# Patient Record
Sex: Male | Born: 1945 | Race: White | Hispanic: No | Marital: Single | Smoking: Former smoker
Health system: Southern US, Community
[De-identification: ages and names within clinical notes are randomized; demographics above are authoritative.]

## PROBLEM LIST (undated history)

## (undated) DIAGNOSIS — J90 Pleural effusion, not elsewhere classified: Secondary | ICD-10-CM

## (undated) DIAGNOSIS — I714 Abdominal aortic aneurysm, without rupture, unspecified: Secondary | ICD-10-CM

## (undated) DIAGNOSIS — K5090 Crohn's disease, unspecified, without complications: Secondary | ICD-10-CM

## (undated) DIAGNOSIS — K429 Umbilical hernia without obstruction or gangrene: Secondary | ICD-10-CM

## (undated) DIAGNOSIS — F17210 Nicotine dependence, cigarettes, uncomplicated: Secondary | ICD-10-CM

## (undated) DIAGNOSIS — I3139 Other pericardial effusion (noninflammatory): Secondary | ICD-10-CM

## (undated) DIAGNOSIS — I313 Pericardial effusion (noninflammatory): Secondary | ICD-10-CM

## (undated) DIAGNOSIS — Z86711 Personal history of pulmonary embolism: Secondary | ICD-10-CM

---

## 2020-01-08 HISTORY — PX: THORACENTESIS: SHX235

## 2020-01-18 ENCOUNTER — Encounter (HOSPITAL_COMMUNITY): Payer: Self-pay

## 2020-01-18 ENCOUNTER — Inpatient Hospital Stay (HOSPITAL_COMMUNITY): Payer: No Typology Code available for payment source

## 2020-01-18 ENCOUNTER — Inpatient Hospital Stay (HOSPITAL_COMMUNITY)
Admission: EM | Admit: 2020-01-18 | Discharge: 2020-01-25 | DRG: 270 | Disposition: A | Discharge status: Home / Self Care | Payer: No Typology Code available for payment source | Attending: Internal Medicine | Admitting: Internal Medicine

## 2020-01-18 ENCOUNTER — Other Ambulatory Visit: Payer: Self-pay

## 2020-01-18 DIAGNOSIS — I714 Abdominal aortic aneurysm, without rupture, unspecified: Secondary | ICD-10-CM | POA: Diagnosis present

## 2020-01-18 DIAGNOSIS — I314 Cardiac tamponade: Secondary | ICD-10-CM | POA: Diagnosis present

## 2020-01-18 DIAGNOSIS — R071 Chest pain on breathing: Secondary | ICD-10-CM | POA: Diagnosis not present

## 2020-01-18 DIAGNOSIS — J910 Malignant pleural effusion: Secondary | ICD-10-CM | POA: Diagnosis present

## 2020-01-18 DIAGNOSIS — Z8679 Personal history of other diseases of the circulatory system: Secondary | ICD-10-CM | POA: Diagnosis not present

## 2020-01-18 DIAGNOSIS — E43 Unspecified severe protein-calorie malnutrition: Secondary | ICD-10-CM | POA: Insufficient documentation

## 2020-01-18 DIAGNOSIS — J90 Pleural effusion, not elsewhere classified: Secondary | ICD-10-CM | POA: Diagnosis not present

## 2020-01-18 DIAGNOSIS — I739 Peripheral vascular disease, unspecified: Secondary | ICD-10-CM | POA: Diagnosis present

## 2020-01-18 DIAGNOSIS — Z20822 Contact with and (suspected) exposure to covid-19: Secondary | ICD-10-CM | POA: Diagnosis present

## 2020-01-18 DIAGNOSIS — I2699 Other pulmonary embolism without acute cor pulmonale: Secondary | ICD-10-CM | POA: Diagnosis present

## 2020-01-18 DIAGNOSIS — I313 Pericardial effusion (noninflammatory): Secondary | ICD-10-CM | POA: Diagnosis present

## 2020-01-18 DIAGNOSIS — E876 Hypokalemia: Secondary | ICD-10-CM | POA: Diagnosis present

## 2020-01-18 DIAGNOSIS — Z7901 Long term (current) use of anticoagulants: Secondary | ICD-10-CM | POA: Diagnosis not present

## 2020-01-18 DIAGNOSIS — R079 Chest pain, unspecified: Secondary | ICD-10-CM | POA: Diagnosis not present

## 2020-01-18 DIAGNOSIS — Z87891 Personal history of nicotine dependence: Secondary | ICD-10-CM

## 2020-01-18 DIAGNOSIS — J9601 Acute respiratory failure with hypoxia: Secondary | ICD-10-CM | POA: Diagnosis present

## 2020-01-18 DIAGNOSIS — R0600 Dyspnea, unspecified: Secondary | ICD-10-CM

## 2020-01-18 DIAGNOSIS — D649 Anemia, unspecified: Secondary | ICD-10-CM | POA: Diagnosis present

## 2020-01-18 DIAGNOSIS — Z86711 Personal history of pulmonary embolism: Secondary | ICD-10-CM | POA: Diagnosis not present

## 2020-01-18 DIAGNOSIS — D6869 Other thrombophilia: Secondary | ICD-10-CM | POA: Diagnosis present

## 2020-01-18 DIAGNOSIS — C3490 Malignant neoplasm of unspecified part of unspecified bronchus or lung: Secondary | ICD-10-CM | POA: Diagnosis present

## 2020-01-18 DIAGNOSIS — Z6821 Body mass index (BMI) 21.0-21.9, adult: Secondary | ICD-10-CM

## 2020-01-18 DIAGNOSIS — I3139 Other pericardial effusion (noninflammatory): Secondary | ICD-10-CM

## 2020-01-18 DIAGNOSIS — D696 Thrombocytopenia, unspecified: Secondary | ICD-10-CM | POA: Diagnosis present

## 2020-01-18 DIAGNOSIS — K5090 Crohn's disease, unspecified, without complications: Secondary | ICD-10-CM | POA: Diagnosis present

## 2020-01-18 DIAGNOSIS — Z4682 Encounter for fitting and adjustment of non-vascular catheter: Secondary | ICD-10-CM

## 2020-01-18 HISTORY — DX: Umbilical hernia without obstruction or gangrene: K42.9

## 2020-01-18 HISTORY — DX: Nicotine dependence, cigarettes, uncomplicated: F17.210

## 2020-01-18 HISTORY — DX: Pericardial effusion (noninflammatory): I31.3

## 2020-01-18 HISTORY — DX: Abdominal aortic aneurysm, without rupture, unspecified: I71.40

## 2020-01-18 HISTORY — DX: Abdominal aortic aneurysm, without rupture: I71.4

## 2020-01-18 HISTORY — DX: Pleural effusion, not elsewhere classified: J90

## 2020-01-18 HISTORY — DX: Personal history of pulmonary embolism: Z86.711

## 2020-01-18 HISTORY — DX: Crohn's disease, unspecified, without complications: K50.90

## 2020-01-18 HISTORY — DX: Other pericardial effusion (noninflammatory): I31.39

## 2020-01-18 LAB — TROPONIN I (HIGH SENSITIVITY): Troponin I (High Sensitivity): 12 ng/L (ref <18)

## 2020-01-18 LAB — COMPREHENSIVE METABOLIC PANEL
ALT: 115 U/L — ABNORMAL HIGH (ref 0–44)
AST: 115 U/L — ABNORMAL HIGH (ref 15–41)
Albumin: 2.8 g/dL — ABNORMAL LOW (ref 3.5–5.0)
Alkaline Phosphatase: 73 U/L (ref 38–126)
Anion gap: 15 (ref 5–15)
BUN: 28 mg/dL — ABNORMAL HIGH (ref 8–23)
CO2: 17 mmol/L — ABNORMAL LOW (ref 22–32)
Calcium: 8.4 mg/dL — ABNORMAL LOW (ref 8.9–10.3)
Chloride: 101 mmol/L (ref 98–111)
Creatinine, Ser: 1.64 mg/dL — ABNORMAL HIGH (ref 0.61–1.24)
GFR calc Af Amer: 48 mL/min — ABNORMAL LOW (ref >60)
GFR calc non Af Amer: 41 mL/min — ABNORMAL LOW (ref >60)
Glucose, Bld: 130 mg/dL — ABNORMAL HIGH (ref 70–99)
Potassium: 4.5 mmol/L (ref 3.5–5.1)
Sodium: 133 mmol/L — ABNORMAL LOW (ref 135–145)
Total Bilirubin: 1 mg/dL (ref 0.3–1.2)
Total Protein: 5.5 g/dL — ABNORMAL LOW (ref 6.5–8.1)

## 2020-01-18 LAB — CBC
HCT: 37.1 % — ABNORMAL LOW (ref 39.0–52.0)
Hemoglobin: 11.1 g/dL — ABNORMAL LOW (ref 13.0–17.0)
MCH: 29.1 pg (ref 26.0–34.0)
MCHC: 29.9 g/dL — ABNORMAL LOW (ref 30.0–36.0)
MCV: 97.1 fL (ref 80.0–100.0)
Platelets: 251 10*3/uL (ref 150–400)
RBC: 3.82 MIL/uL — ABNORMAL LOW (ref 4.22–5.81)
RDW: 13.4 % (ref 11.5–15.5)
WBC: 16.9 10*3/uL — ABNORMAL HIGH (ref 4.0–10.5)
nRBC: 0 % (ref 0.0–0.2)

## 2020-01-18 LAB — ECHOCARDIOGRAM COMPLETE
Height: 68 in
Weight: 2320 oz

## 2020-01-18 LAB — PROTIME-INR
INR: 1.6 — ABNORMAL HIGH (ref 0.8–1.2)
Prothrombin Time: 18.4 seconds — ABNORMAL HIGH (ref 11.4–15.2)

## 2020-01-18 LAB — APTT: aPTT: 33 seconds (ref 24–36)

## 2020-01-18 LAB — LACTIC ACID, PLASMA: Lactic Acid, Venous: 3.1 mmol/L (ref 0.5–1.9)

## 2020-01-18 LAB — SARS CORONAVIRUS 2 BY RT PCR (HOSPITAL ORDER, PERFORMED IN ~~LOC~~ HOSPITAL LAB): SARS Coronavirus 2: NEGATIVE

## 2020-01-18 MED ORDER — DOCUSATE SODIUM 100 MG PO CAPS: 100.0000 mg | ORAL_CAPSULE | Freq: Two times a day (BID) | ORAL | Status: DC | PRN

## 2020-01-18 MED ORDER — FENTANYL CITRATE (PF) 100 MCG/2ML IJ SOLN
25.0000 ug | Freq: Once | INTRAMUSCULAR | Status: AC
  Administered 2020-01-18: 25 ug via INTRAVENOUS
  Filled 2020-01-18: qty 2

## 2020-01-18 MED ORDER — POLYETHYLENE GLYCOL 3350 17 G PO PACK: 17.0000 g | PACK | Freq: Every day | ORAL | Status: DC | PRN

## 2020-01-18 NOTE — ED Provider Notes (Signed)
[facility] EMERGENCY DEPARTMENT Provider Note   CSN: [id number] Arrival date & time: 01/18/20  1950     History Chief Complaint  Patient presents with   Shortness of Breath    Glen Scott is a 74 y.o. male.  The history is provided by the patient.  Shortness of Breath Severity:  Moderate Onset quality:  Gradual Duration:  3 weeks Timing:  Constant Progression:  Worsening Chronicity:  New Relieved by:  Nothing Worsened by:  Nothing Ineffective treatments:  None tried Associated symptoms: abdominal pain and chest pain   Associated symptoms: no cough, no ear pain, no fever, no rash, no sore throat and no vomiting        Past Medical History:  Diagnosis Date   AAA (abdominal aortic aneurysm) (HCC)    Bilateral pleural effusion    Cigarette smoker    Crohn's disease ([place])    History of pulmonary embolus (PE)    Pericardial effusion    Umbilical hernia     Patient Active Problem List   Diagnosis Date Noted   Bilateral pulmonary embolism ([place]) 01/19/2020   Pleural effusion, left 01/19/2020   AAA (abdominal aortic aneurysm) without rupture ([place]) 01/19/2020   Pericardial effusion with cardiac tamponade 01/18/2020   Chest pain     Past Surgical History:  Procedure Laterality Date   THORACENTESIS  01/08/2020   [place], [facility]       Family History  Family history unknown: Yes    Social History   Tobacco Use   Smoking status: Not on file  Substance Use Topics   Alcohol use: Not on file   Drug use: Not on file    Home Medications Prior to Admission medications   Medication Sig Start Date End Date Taking? Authorizing Provider  colchicine 0.6 MG tablet Take 0.6 mg by mouth 2 (two) times daily.   Yes [provider]  ipratropium-albuterol (DUONEB) 0.5-2.5 (3) MG/3ML SOLN Take 3 mLs by nebulization in the morning and at bedtime.   Yes [provider]  Rivaroxaban (XARELTO) 15 MG TABS tablet Take  15 mg by mouth 2 (two) times daily with a meal. Started on 01-10-20. Take until 01-29-20   Yes [provider]    Allergies    Morphine and related and Penicillins  Review of Systems   Review of Systems  Constitutional: Negative for chills and fever.  HENT: Negative for ear pain and sore throat.   Eyes: Negative for pain and visual disturbance.  Respiratory: Positive for shortness of breath. Negative for cough.   Cardiovascular: Positive for chest pain. Negative for palpitations.  Gastrointestinal: Positive for abdominal pain. Negative for vomiting.  Genitourinary: Negative for dysuria and hematuria.  Musculoskeletal: Negative for arthralgias and back pain.  Skin: Negative for color change and rash.  Neurological: Negative for seizures and syncope.  All other systems reviewed and are negative.   Physical Exam Updated Vital Signs BP (!) 148/79    Pulse 88    Temp 98.7 F (37.1 C) (Oral)    Resp 17    Ht 5\' 8"  (1.727 m)    Wt 67.7 kg    SpO2 100%    BMI 22.69 kg/m   Physical Exam Vitals and nursing note reviewed.  Constitutional:      Appearance: He is well-developed.     Comments: Appears uncomfortable  HENT:     Head: Normocephalic and atraumatic.  Eyes:     Conjunctiva/sclera: Conjunctivae normal.  Cardiovascular:  Rate and Rhythm: Regular rhythm. Tachycardia present.     Heart sounds: No murmur heard.   Pulmonary:     Effort: Pulmonary effort is normal. No respiratory distress.     Breath sounds: Normal breath sounds.  Abdominal:     Palpations: Abdomen is soft.     Tenderness: There is no abdominal tenderness.     Comments: Tender midline pulsatile mass.    Musculoskeletal:     Cervical back: Neck supple.     Right lower leg: No edema.     Left lower leg: No edema.  Skin:    General: Skin is warm and dry.  Neurological:     General: No focal deficit present.     Mental Status: He is alert and oriented to person, place, and time.     ED Results /  Procedures / Treatments   Labs (all labs ordered are listed, but only abnormal results are displayed) Labs Reviewed  CBC - Abnormal; Notable for the following components:      Result Value   WBC 16.9 (*)    RBC 3.82 (*)    Hemoglobin 11.1 (*)    HCT 37.1 (*)    MCHC 29.9 (*)    All other components within normal limits  COMPREHENSIVE METABOLIC PANEL - Abnormal; Notable for the following components:   Sodium 133 (*)    CO2 17 (*)    Glucose, Bld 130 (*)    BUN 28 (*)    Creatinine, Ser 1.64 (*)    Calcium 8.4 (*)    Total Protein 5.5 (*)    Albumin 2.8 (*)    AST 115 (*)    ALT 115 (*)    GFR calc non Af Amer 41 (*)    GFR calc Af Amer 48 (*)    All other components within normal limits  PROTIME-INR - Abnormal; Notable for the following components:   Prothrombin Time 18.4 (*)    INR 1.6 (*)    All other components within normal limits  LACTIC ACID, PLASMA - Abnormal; Notable for the following components:   Lactic Acid, Venous 3.1 (*)    All other components within normal limits  LACTIC ACID, PLASMA - Abnormal; Notable for the following components:   Lactic Acid, Venous 2.9 (*)    All other components within normal limits  BODY FLUID CELL COUNT WITH DIFFERENTIAL - Abnormal; Notable for the following components:   Color, Fluid RED (*)    Appearance, Fluid TURBID (*)    Total Nucleated Cell Count, Fluid 1,600 (*)    Neutrophil Count, Fluid 67 (*)    Monocyte-Macrophage-Serous Fluid 31 (*)    All other components within normal limits  SARS CORONAVIRUS 2 BY RT PCR (HOSPITAL ORDER, [facility])  MRSA PCR SCREENING  GRAM STAIN  GRAM STAIN/BODY FLUID CULTURE  CULTURE, BODY FLUID-BOTTLE  APTT  GLUCOSE, BODY FLUID OTHER  PROTEIN, BODY FLUID (OTHER)  LD, BODY FLUID (OTHER)  PH, BODY FLUID  TYPE AND SCREEN  ABO/RH  CYTOLOGY - NON PAP  TROPONIN I (HIGH SENSITIVITY)  TROPONIN I (HIGH SENSITIVITY)    EKG EKG  Interpretation  Date/Time:  Saturday January 18 2020 20:07:27 EDT Ventricular Rate:  112 PR Interval:    QRS Duration: 73 QT Interval:  401 QTC Calculation: 548 R Axis:   27 Text Interpretation: Sinus tachycardia Low voltage, extremity and precordial leads Nonspecific T abnormalities, lateral leads Prolonged QT interval No old tracing to compare  Confirmed by Delora Fuel ([id number]) on 01/18/2020 11:36:27 PM Also confirmed by Delora Fuel ([id number]), editor [place], [place] [phone])  on 01/19/2020 9:04:55 AM   Radiology CARDIAC CATHETERIZATION  Result Date: 01/19/2020 Large pericardial effusion with 1080 mL bloody fluid drained via subxiphoid approach.Large pericardial effusion with 1080 mL bloody fluid drained via subxiphoid approach. [name], MD   DG Chest Port 1 View  Result Date: 01/18/2020 CLINICAL DATA:  Chest pain and shortness of breath. EXAM: PORTABLE CHEST 1 VIEW COMPARISON:  None. FINDINGS: Mild atelectasis and/or infiltrate is seen within the retrocardiac region of the left lung base. There is a small left pleural effusion. No pneumothorax is identified. The cardiac silhouette is markedly enlarged. There is mild calcification of the aortic arch. The visualized skeletal structures are unremarkable. IMPRESSION: 1. Mild left basilar atelectasis and/or infiltrate. 2. Small left pleural effusion. Electronically Signed   By: Virgina Norfolk M.D.   On: 01/18/2020 22:24   ECHOCARDIOGRAM COMPLETE  Result Date: 01/18/2020    ECHOCARDIOGRAM REPORT   Patient Name:   Granger Yonts Date of Exam: 01/18/2020 Medical Rec #:  [id number]  Height:       68.0 in Accession #:    [id number] Weight:       145.0 lb Date of Birth:  [birth year]  BSA:          1.783 m Patient Age:    67 years   BP:           103/80 mmHg Patient Gender: M          HR:           107 bpm. Exam Location:  Inpatient Procedure: 2D Echo, 3D Echo, Color Doppler and Cardiac Doppler STAT ECHO Indications:    I31.3 Pericardial effusion  (noninflammatory)  History:        Patient has no prior history of Echocardiogram examinations.                 Risk Factors:Current Smoker.  Sonographer:    Raquel Sarna Senior RDCS Referring Phys: [place]  Sonographer Comments: Respirometer not tracking well, labels made by sonographer observation. IMPRESSIONS  1. Left ventricular ejection fraction, by estimation, is 65 to 70%. The left ventricle has normal function. The left ventricle has no regional wall motion abnormalities. Left ventricular diastolic function could not be evaluated.  2. Right ventricular systolic function is normal. The right ventricular size is normal. There is normal pulmonary artery systolic pressure. The estimated right ventricular systolic pressure is 69.6 mmHg.  3. The mitral valve is normal in structure. No evidence of mitral valve regurgitation. No evidence of mitral stenosis.  4. The aortic valve is normal in structure. Aortic valve regurgitation is not visualized. No aortic stenosis is present.  5. The inferior vena cava is dilated in size with <50% respiratory variability, suggesting right atrial pressure of 15 mmHg.  6. Larger circumferential pericardial effusion measuring 2.79 x 3.33cm at greatest diameter. There is RA inversion. Possible subtle RV diastolic collapse. The RV cavity is small. Borderline respirophasic changes in MV inflow velocities with a 29mmHg difference. The IVC is dilated with < 50% respirophasic change. Findings worrisome for cardiac tamponade. . Large pericardial effusion. The pericardial effusion is circumferential. Moderate pleural effusion in the left lateral region. FINDINGS  Left Ventricle: Left ventricular ejection fraction, by estimation, is 65 to 70%. The left ventricle has normal function. The left ventricle has no regional wall motion abnormalities. The left ventricular internal cavity size was normal  in size. There is  no left ventricular hypertrophy. Left ventricular diastolic function  could not be evaluated. Right Ventricle: The right ventricular size is normal. No increase in right ventricular wall thickness. Right ventricular systolic function is normal. There is normal pulmonary artery systolic pressure. The tricuspid regurgitant velocity is 2.05 m/s, and  with an assumed right atrial pressure of 15 mmHg, the estimated right ventricular systolic pressure is 15.1 mmHg. Left Atrium: Left atrial size was normal in size. Right Atrium: Right atrial size was normal in size. Pericardium: Larger circumferential pericardial effusion measuring 2.79 x 3.33cm at greatest diameter. There is RA inversion. Possible subtle RV diastolic collapse. The RV cavity is small. Borderline respirophasic changes in MV inflow velocities with a 104mmHg difference. The IVC is dilated with < 50% respirophasic change. Findings worrisome for cardiac tamponade. A large pericardial effusion is present. The pericardial effusion is circumferential. Mitral Valve: The mitral valve is normal in structure. Normal mobility of the mitral valve leaflets. No evidence of mitral valve regurgitation. No evidence of mitral valve stenosis. Tricuspid Valve: The tricuspid valve is normal in structure. Tricuspid valve regurgitation is trivial. No evidence of tricuspid stenosis. Aortic Valve: The aortic valve is normal in structure. Aortic valve regurgitation is not visualized. No aortic stenosis is present. Pulmonic Valve: The pulmonic valve was normal in structure. Pulmonic valve regurgitation is not visualized. No evidence of pulmonic stenosis. Aorta: The aortic root is normal in size and structure. Venous: The inferior vena cava is dilated in size with less than 50% respiratory variability, suggesting right atrial pressure of 15 mmHg. IAS/Shunts: No atrial level shunt detected by color flow Doppler. Additional Comments: There is a moderate pleural effusion in the left lateral region.  RIGHT VENTRICLE RV S prime:     12.10 cm/s TAPSE (M-mode):  1.8 cm TRICUSPID VALVE TR Peak grad:   16.8 mmHg TR Vmax:        205.00 cm/s Fransico Him MD Electronically signed by Fransico Him MD Signature Date/Time: 01/18/2020/11:07:22 PM    Final     Procedures Procedures (including critical care time)  Medications Ordered in ED Medications  docusate sodium (COLACE) capsule 100 mg ( Oral MAR Unhold 01/19/20 0550)  polyethylene glycol (MIRALAX / GLYCOLAX) packet 17 g ( Oral MAR Unhold 01/19/20 0550)  Chlorhexidine Gluconate Cloth 2 % PADS 6 each ( Topical MAR Unhold 01/19/20 0550)  colchicine tablet 0.6 mg (0.6 mg Oral Given 01/19/20 0941)  sodium chloride flush (NS) 0.9 % injection 3 mL (3 mLs Intravenous Given 01/19/20 0941)  sodium chloride flush (NS) 0.9 % injection 3 mL (has no administration in time range)  0.9 %  sodium chloride infusion (has no administration in time range)  ondansetron (ZOFRAN) injection 4 mg (4 mg Intravenous Given 01/19/20 1130)  fentaNYL (SUBLIMAZE) injection 25-50 mcg (50 mcg Intravenous Given 01/19/20 1318)  fentaNYL (SUBLIMAZE) injection 25 mcg (25 mcg Intravenous Given 01/18/20 2248)  lactated ringers bolus 1,000 mL (0 mLs Intravenous Stopped 01/19/20 0941)    ED Course  I have reviewed the triage vital signs and the nursing notes.  Pertinent labs & imaging results that were available during my care of the patient were reviewed by me and considered in my medical decision making (see chart for details).    MDM Rules/Calculators/A&P                          74 year old male with history of recently diagnosed lung malignancy presents as  a transfer from an outside emergency room due to concern for pericardial effusion and enlarging AAA.  Patient states he has been having worsening abdominal pain, chest pain, shortness of breath for the past few weeks.  He was seen at an outside emergency department few weeks ago where he was diagnosed with bilateral pulmonary embolisms as well as a mild to moderate sized pericardial effusion.   Patient was started on Eliquis and then discharged home.  He has been having worsening shortness of breath and chest pain over the past few days which prompted his arrival to the emergency department.  Imaging at the outside emergency department was most notable for a dramatically enlarged pericardial effusion as well as slight enlargement of the AAA with intramural thrombus.  Radiology at outside emerge, cannot rule out acute dissection.  Given these findings patient was transferred to [facility] for further evaluation and management.  Upon arrival to the emerge department patient continued to complain of chest pain, shortness of breath, abdominal pain.  Denies fever, chills, nausea, vomiting.  Afebrile.  Pulse 110s.  Systolic blood pressure 295A.  Remainder vital signs stable.  Exam as above.  Exam most notable for tender midline pulsatile abdominal mass.  Bedside echo most notable for pericardial effusion with evidence of tamponade given right ventricular diastolic collapse.  Given these findings as well as imaging reports from outside emergency department critical care was consulted as well as cardiology.  Critical care evaluated the patient at bedside and recommended vascular consult which they called themselves to evaluate for this AAA.  Cardiology evaluated the patient at bedside and recommended stat echocardiogram with possible CT surgery evaluation for drainage of his pericardial effusion.  Stat echo notable for c/f tamponade physiology.  CT surgery was consulted who recommended that pt receive pericardial drainage w/ drain placement prior to consideration for pericardial window.  This recommendation was relayed to the ICU team prior to pt's transfer to the ICU.  Pt was admitted and transferred to the ICU in stable condition w/o further events during my shift.    Final Clinical Impression(s) / ED Diagnoses Final diagnoses:  Chest pain    Rx / DC Orders ED Discharge Orders    None        Silvestre Gunner, MD 01/19/20 [place], Wenda Overland, MD 01/20/20 [phone]

## 2020-01-18 NOTE — ED Triage Notes (Addendum)
Pt arrived via EMS transferred from [facility]. Pt came initially presented to their ER for n/v and sob. Pt states he was told he has a pericardial effusion and the [facility] ED was concerned his aaa may have dissected. Pt is c/o right sided upper abdominal pain at this time.

## 2020-01-18 NOTE — Progress Notes (Signed)
Echocardiogram 2D Echocardiogram has been performed.  Glen Scott [place] Spees 01/18/2020, 10:33 PM   Dr. Radford Pax notified of stat

## 2020-01-18 NOTE — Consult Note (Signed)
Cardiology Consultation:   Patient ID: Glen Scott MRN: [id number]; DOB: 1946-02-15  Admit date: 01/18/2020 Date of Consult: 01/18/2020  Primary Care Provider: Leisa Lenz, MD Primary Cardiologist: None (NEW).  Sees [place] Primary Electrophysiologist:  None    Patient Profile:   Glen Scott is a 74 y.o. male with a hx of AAA, bilateral pleural effusions, tobacco use, acute PE and large pericardial effusion who is being seen today for the evaluation of pericardial effusion at the request of Jorje Guild, MD.  History of Present Illness:   [name] is a 74 y.o. M with PMH of Crohn's Disease, tobacco use, recent diagnosis of probable lung Ca at the [facility] (malignant cells by thoracentesis on 8/4 Oncology follow up is pending), AAA and bilateral PE's on Xarelto who woke up this morning feeling at baseline.  States he went outside and became overheated with increased upper abdominal pain that wraps around to his mid-back, dyspnea, nausea and vomiting.  He presented to the [place] in [place] where imaging showed likely enlarging pericardial effusion with tamponade physiology and increased thrombus in known AAA.  Pt was transferred to [facility] ED for specialist services.   CT at [facility] large pericardial effusion with multifocal airspace opacities, adenopathy and pleural effusions with 4.4cm infrarenal abdominal aortic aneurysm with thrombus in the aneurysmal sac > 60% of lumen with narrowing of the superior mesenteric artery which has increased significantly from study 01/07/2020.  Other findings concerning for possible leaking aneurysm or inflammatory aneurysm/mycotic aneurysm could not be ruled out.    Currently he is awake and alert.  He is tachy on exam but in NAD on 5L O2.  Complains of abdominal pain across his abdomen and into his back with SOB.    Past Medical History:  Diagnosis Date  . AAA (abdominal aortic aneurysm) ([place])   . Bilateral pleural effusion   . Cigarette smoker   . Crohn's  disease ([place])   . History of pulmonary embolus (PE)   . Pericardial effusion   . Umbilical hernia       Home Medications:  Prior to Admission medications   Medication Sig Start Date End Date Taking? Authorizing Provider  colchicine 0.6 MG tablet Take 0.6 mg by mouth 2 (two) times daily.   Yes [provider]  ipratropium-albuterol (DUONEB) 0.5-2.5 (3) MG/3ML SOLN Take 3 mLs by nebulization in the morning and at bedtime.   Yes [provider]  Rivaroxaban (XARELTO) 15 MG TABS tablet Take 15 mg by mouth 2 (two) times daily with a meal. Started on 01-10-20. Take until 01-29-20   Yes [provider]    Inpatient Medications: Scheduled Meds: . fentaNYL (SUBLIMAZE) injection  25 mcg Intravenous Once   Continuous Infusions:  PRN Meds: docusate sodium, polyethylene glycol  Allergies:    Allergies  Allergen Reactions  . Morphine And Related     "I bleed out from my pores"  . Penicillins     Social History:   Social History   Socioeconomic History  . Marital status: Single    Spouse name: Not on file  . Number of children: Not on file  . Years of education: Not on file  . Highest education level: Not on file  Occupational History  . Not on file  Tobacco Use  . Smoking status: Not on file  Substance and Sexual Activity  . Alcohol use: Not on file  . Drug use: Not on file  . Sexual activity: Not on file  Other  Topics Concern  . Not on file  Social History Narrative  . Not on file   Social Determinants of Health   Financial Resource Strain:   . Difficulty of Paying Living Expenses:   Food Insecurity:   . Worried About Charity fundraiser in the Last Year:   . Arboriculturist in the Last Year:   Transportation Needs:   . Film/video editor (Medical):   Marland Kitchen Lack of Transportation (Non-Medical):   Physical Activity:   . Days of Exercise per Week:   . Minutes of Exercise per Session:   Stress:   . Feeling of Stress :   Social Connections:    . Frequency of Communication with Friends and Family:   . Frequency of Social Gatherings with Friends and Family:   . Attends Religious Services:   . Active Member of Clubs or Organizations:   . Attends Archivist Meetings:   Marland Kitchen Marital Status:   Intimate Partner Violence:   . Fear of Current or Ex-Partner:   . Emotionally Abused:   Marland Kitchen Physically Abused:   . Sexually Abused:     Family History:   No family history on file.   ROS:  Please see the history of present illness.   All other ROS reviewed and negative.     Physical Exam/Data:   Vitals:   01/18/20 2000 01/18/20 2003 01/18/20 2004 01/18/20 2015  BP:  103/80    Pulse:   (!) 113   Resp:  (!) 29 (!) 21   Temp:    97.8 F (36.6 C)  TempSrc:    Oral  SpO2:   99%   Weight: 65.8 kg     Height: 5\' 8"  (1.727 m)      No intake or output data in the 24 hours ending 01/18/20 2234 Last 3 Weights 01/18/2020  Weight (lbs) 145 lb  Weight (kg) 65.772 kg     Body mass index is 22.05 kg/m.  General:  Thin ill appearing in NAD HEENT: normal Lymph: no adenopathy Neck: no JVD Endocrine:  No thryomegaly Vascular: No carotid bruits; FA pulses 2+ bilaterally without bruits  Cardiac:  normal S1, S2; regular and tachy; no murmur  Lungs:  Decreased BS at bases Abd: soft, nontender, no hepatomegaly  Ext: no edema Musculoskeletal:  No deformities, BUE and BLE strength normal and equal Skin: warm and dry  Neuro:  CNs 2-12 intact, no focal abnormalities noted Psych:  Normal affect   EKG:  The EKG was personally reviewed and demonstrates:  Sinus tachycardia, low voltage QRS and no ST changes Telemetry:  Telemetry was personally reviewed and demonstrates:  Sinus tachycardia  Relevant CV Studies: 2D echo 01/08/2020 IMPRESSIONS    1. Left ventricular ejection fraction, by estimation, is 65 to 70%. The  left ventricle has normal function. The left ventricle has no regional  wall motion abnormalities. Left ventricular  diastolic function could not  be evaluated.  2. Right ventricular systolic function is normal. The right ventricular  size is normal. There is normal pulmonary artery systolic pressure. The  estimated right ventricular systolic pressure is 88.4 mmHg.  3. The mitral valve is normal in structure. No evidence of mitral valve  regurgitation. No evidence of mitral stenosis.  4. The aortic valve is normal in structure. Aortic valve regurgitation is  not visualized. No aortic stenosis is present.  5. The inferior vena cava is dilated in size with <50% respiratory  variability, suggesting right atrial pressure  of 15 mmHg.  6. Larger circumferential pericardial effusion measuring 2.79 x 3.33cm at  greatest diameter. There is RA inversion. Possible subtle RV diastolic  collapse. The RV cavity is small. Borderline respirophasic changes in MV  inflow velocities with a 57mmHg  difference. The IVC is dilated with < 50% respirophasic change. Findings  worrisome for cardiac tamponade. . Large pericardial effusion. The  pericardial effusion is circumferential. Moderate pleural effusion in the  left lateral region.   Laboratory Data:  High Sensitivity Troponin:  No results for input(s): TROPONINIHS in the last 720 hours.   ChemistryNo results for input(s): NA, K, CL, CO2, GLUCOSE, BUN, CREATININE, CALCIUM, GFRNONAA, GFRAA, ANIONGAP in the last 168 hours.  No results for input(s): PROT, ALBUMIN, AST, ALT, ALKPHOS, BILITOT in the last 168 hours. HematologyNo results for input(s): WBC, RBC, HGB, HCT, MCV, MCH, MCHC, RDW, PLT in the last 168 hours. BNPNo results for input(s): BNP, PROBNP in the last 168 hours.  DDimer No results for input(s): DDIMER in the last 168 hours.   Radiology/Studies:  [facility] Chest Port 1 View  Result Date: 01/18/2020 CLINICAL DATA:  Chest pain and shortness of breath. EXAM: PORTABLE CHEST 1 VIEW COMPARISON:  None. FINDINGS: Mild atelectasis and/or infiltrate is seen within the  retrocardiac region of the left lung base. There is a small left pleural effusion. No pneumothorax is identified. The cardiac silhouette is markedly enlarged. There is mild calcification of the aortic arch. The visualized skeletal structures are unremarkable. IMPRESSION: 1. Mild left basilar atelectasis and/or infiltrate. 2. Small left pleural effusion. Electronically Signed   By: Virgina Norfolk M.D.   On: 01/18/2020 22:24       Assessment and Plan:   1. Pericardial effusion -diagnosed recently by Chest CT at [facility] in workup of SOB -he was recently dx with lung CA by pleural fluid analysis so concerning that this could be a malignant effusion -presented to [facility] today with more SOB and found to have increase in size of pericardial effusion with concern for tamponade -2D echo today at [facility] personally reviewed.  The effusion is very large with RA inversion.  The RV cavity is small with subtle RV diastolic collapse.  The IVC is dilated with < 50% variation with respirations.  MV inflow velocity changes about 47mmHg with respirations. -given his tachycardia, soft BP and echo findings, I am concerned that he has pericardial tamponade.   -Given high suspicion for malignant effusion with possible hemorrhagic conversion in setting of possible pericardial involvement of lung CA, recommend CVTS consult tonight for pericardial drain  2.  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  Acute bilateral PEs -this is a recent dx -likely hypercoagulable from probable lung CA -he is currently on Xarelto>>this has been stopped by CCM  4.  AAA -recent study showed 4.3cm AAA with increased growth in a few weeks to 4.4cm with new finding of large saccular thombus encompassing 60% of lumen -Vascular surgery has been consulted      For  questions or updates, please contact [place] Please consult [email or web address] for contact info under     Signed, Fransico Him, MD  01/18/2020 10:34 PM

## 2020-01-18 NOTE — H&P (Signed)
NAME:  Glen Scott, MRN:  [id number], DOB:  1945-08-03, LOS: 0 ADMISSION DATE:  01/18/2020, CONSULTATION DATE:  01/18/20 REFERRING MD:  EDP, CHIEF COMPLAINT:  Dyspnea and abdominal pain   Brief History   74 y.o. M with PMH of recently diagnosed malignant cells on thoracentesis at the [facility] on 01/08/20, Colitis, bilateral PE on xarelto, AAA who developed dyspnea, abdominal pain, and n/v this morning, so presented to [facility] where CT abd/pelvis showed worsening thrombus in known AAA with paricardial effusion and possible tamponade.  Accepted in transfer to the ED where PCCM was consulted  History of present illness   Glen Scott is a 74 y.o. 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  States he went outside and became overheated with increased upper abdominal pain that wraps around to his mid-back, dyspnea, nausea and vomiting.  He presented to the [place] in [place] where imaging showed likely enlarging pericardial effusion with tamponade physiology and increased thrombus in known AAA.  Pt was transferred to [facility] ED for specialist services.    At the time of exam, all studies pending. Pt is awake and alert, tachycardic and requiring 5L [place] O2.  He complains of abdominal and mid-back pain and shortness of breath.  Blood pressure is stable.  Cardiology has been consulted and PCCM asked to admit  Past Medical History   has a past medical history of AAA (abdominal aortic aneurysm) ([place]), Bilateral pleural effusion, Cigarette smoker, Crohn's disease ([place]), History of pulmonary embolus (PE), Pericardial effusion, and Umbilical hernia.   Significant Hospital Events   8/14 Transfer from [place] to [facility], PCCM admit  Consults:  Cardiology  Procedures:    Significant Diagnostic Tests:  8/14  CXR>>Mild left basilar atelectasis and/or infiltrate.  Small left pleural effusion.  Micro Data:  8/14 Sars-CoV-2>>  Antimicrobials:     Interim history/subjective:  Vascular surgery contacted, no immediate need for intervention on AAA thrombus  Objective   Blood pressure 103/80, pulse (!) 113, temperature 97.8 F (36.6 C), temperature source Oral, resp. rate (!) 21, height 5\' 8"  (1.727 m), weight 65.8 kg, SpO2 99 %.       No intake or output data in the 24 hours ending 01/18/20 2154 Filed Weights   01/18/20 2000  Weight: 65.8 kg    General:  Thin, elderly M awake and alert and in no acute distress, appears uncomfortable HEENT: MM pink/moist Neuro: alert and oriented x4, following commands CV: s1s2 tachycaric, regular, no m/r/g PULM:  Clear bilaterally, tachypneic, no accessory muscle use GI: soft, bsx4 active, mildly distended with moderate pain in the epigastric region that radiates around the R side to the mid-back Extremities: warm/dry, no edema  Skin: no rashes or lesions   Resolved Hospital Problem list     Assessment & Plan:   Pericardial Effusion with Tamponade physiology Cardiology consulted by the ED and to evaluate whether CT surgery consult indicated for possible surgical intervention.   BP stable though patient dyspneic  -Admit to ICU for close monitoring, may require pressor support -follow Cardiology/CT surgery recommends  -all labs pending    Recent bilateral PE On Xarelto, will hold in the setting of possible surgical intervention, resume as able   AAA with increasing thrombus PCCM attending spoke with vascular on call, no  emergent intervention needed overnight -resume anti-coagulation as able   Likely lung cancer In early stages of work-up through the [place], has spiculated mass on CT and malignant cells via thoracentesis.  Pt states he has not been scheduled an outpatient f/u with oncology yet    Best practice:  Diet:  NPO Pain/Anxiety/Delirium protocol (if indicated): prn fentanyl VAP protocol (if indicated): n/a DVT prophylaxis: SCD's GI prophylaxis: n/a Glucose control: SSI Mobility: bed rest Code Status: full code Family Communication:  Disposition: ICU  Labs   CBC: No results for input(s): WBC, NEUTROABS, HGB, HCT, MCV, PLT in the last 168 hours.  Basic Metabolic Panel: No results for input(s): NA, K, CL, CO2, GLUCOSE, BUN, CREATININE, CALCIUM, MG, PHOS in the last 168 hours. GFR: CrCl cannot be calculated (No successful lab value found.). No results for input(s): PROCALCITON, WBC, LATICACIDVEN in the last 168 hours.  Liver Function Tests: No results for input(s): AST, ALT, ALKPHOS, BILITOT, PROT, ALBUMIN in the last 168 hours. No results for input(s): LIPASE, AMYLASE in the last 168 hours. No results for input(s): AMMONIA in the last 168 hours.  ABG No results found for: PHART, PCO2ART, PO2ART, HCO3, TCO2, ACIDBASEDEF, O2SAT   Coagulation Profile: No results for input(s): INR, PROTIME in the last 168 hours.  Cardiac Enzymes: No results for input(s): CKTOTAL, CKMB, CKMBINDEX, TROPONINI in the last 168 hours.  HbA1C: No results found for: HGBA1C  CBG: No results for input(s): GLUCAP in the last 168 hours.  Review of Systems:   Negative other than noted in HPI  Past Medical History  He,  has a past medical history of AAA (abdominal aortic aneurysm) ([place]), Bilateral pleural effusion, Cigarette smoker, Crohn's disease ([place]), History of pulmonary embolus (PE), Pericardial effusion, and Umbilical hernia.   Surgical History   Partial colectomy  Social History      Family History   His family history is not on file.   Allergies Allergies  Allergen Reactions  . Morphine And Related     "I bleed out from my pores"  . Penicillins      Home Medications  Prior to Admission medications   Medication Sig Start Date End Date Taking? Authorizing Provider  colchicine 0.6 MG  tablet Take 0.6 mg by mouth 2 (two) times daily.   Yes [provider]  ipratropium-albuterol (DUONEB) 0.5-2.5 (3) MG/3ML SOLN Take 3 mLs by nebulization in the morning and at bedtime.   Yes [provider]  Rivaroxaban (XARELTO) 15 MG TABS tablet Take 15 mg by mouth 2 (two) times daily with a meal. Started on 01-10-20. Take until 01-29-20   Yes [provider]     Critical care time: 50 minutes    CRITICAL CARE Performed by: Otilio Carpen Kelliann Pendergraph   Total critical care time: 50 minutes  Critical care time was exclusive of separately billable procedures and treating other patients.  Critical care was necessary to treat or prevent imminent or life-threatening deterioration.  Critical care was time spent personally by me on the following activities: development of treatment plan with patient and/or surrogate as well as nursing, discussions with consultants, evaluation of patient's response to treatment, examination of patient, obtaining history from patient or surrogate, ordering and performing treatments and interventions, ordering and review of laboratory studies, ordering and review of radiographic studies, pulse oximetry and re-evaluation of patient's condition.   Otilio Carpen Virdia Ziesmer, PA-C

## 2020-01-19 ENCOUNTER — Other Ambulatory Visit: Payer: Self-pay

## 2020-01-19 ENCOUNTER — Inpatient Hospital Stay (HOSPITAL_COMMUNITY): Payer: No Typology Code available for payment source

## 2020-01-19 ENCOUNTER — Encounter (HOSPITAL_COMMUNITY): Payer: Self-pay | Admitting: Internal Medicine

## 2020-01-19 ENCOUNTER — Encounter (HOSPITAL_COMMUNITY)
Admission: EM | Disposition: A | Discharge status: Home / Self Care | Payer: Self-pay | Source: Home / Self Care | Attending: Internal Medicine

## 2020-01-19 DIAGNOSIS — I314 Cardiac tamponade: Secondary | ICD-10-CM | POA: Diagnosis not present

## 2020-01-19 DIAGNOSIS — I714 Abdominal aortic aneurysm, without rupture, unspecified: Secondary | ICD-10-CM | POA: Diagnosis present

## 2020-01-19 DIAGNOSIS — I313 Pericardial effusion (noninflammatory): Secondary | ICD-10-CM

## 2020-01-19 DIAGNOSIS — I2699 Other pulmonary embolism without acute cor pulmonale: Secondary | ICD-10-CM

## 2020-01-19 DIAGNOSIS — J90 Pleural effusion, not elsewhere classified: Secondary | ICD-10-CM | POA: Diagnosis present

## 2020-01-19 DIAGNOSIS — J910 Malignant pleural effusion: Secondary | ICD-10-CM

## 2020-01-19 HISTORY — PX: PERICARDIOCENTESIS: CATH118255

## 2020-01-19 LAB — ECHOCARDIOGRAM LIMITED
Height: 68 in
Weight: 2388.02 oz

## 2020-01-19 LAB — GRAM STAIN

## 2020-01-19 LAB — BODY FLUID CELL COUNT WITH DIFFERENTIAL
Eos, Fluid: 0 %
Lymphs, Fluid: 2 %
Monocyte-Macrophage-Serous Fluid: 31 % — ABNORMAL LOW (ref 50–90)
Neutrophil Count, Fluid: 67 % — ABNORMAL HIGH (ref 0–25)
Total Nucleated Cell Count, Fluid: 1600 cu mm — ABNORMAL HIGH (ref 0–1000)

## 2020-01-19 LAB — ABO/RH: ABO/RH(D): B NEG

## 2020-01-19 LAB — TROPONIN I (HIGH SENSITIVITY): Troponin I (High Sensitivity): 15 ng/L (ref <18)

## 2020-01-19 LAB — LACTIC ACID, PLASMA: Lactic Acid, Venous: 2.9 mmol/L (ref 0.5–1.9)

## 2020-01-19 LAB — MRSA PCR SCREENING: MRSA by PCR: NEGATIVE

## 2020-01-19 LAB — TYPE AND SCREEN
ABO/RH(D): B NEG
Antibody Screen: NEGATIVE

## 2020-01-19 SURGERY — PERICARDIOCENTESIS
Anesthesia: LOCAL

## 2020-01-19 MED ORDER — ONDANSETRON HCL 4 MG/2ML IJ SOLN
4.0000 mg | Freq: Four times a day (QID) | INTRAMUSCULAR | Status: DC | PRN
  Administered 2020-01-19 – 2020-01-20 (×2): 4 mg via INTRAVENOUS
  Filled 2020-01-19: qty 2

## 2020-01-19 MED ORDER — SODIUM CHLORIDE 0.9% FLUSH: 3.0000 mL | INTRAVENOUS | Status: DC | PRN

## 2020-01-19 MED ORDER — ORAL CARE MOUTH RINSE
15.0000 mL | Freq: Two times a day (BID) | OROMUCOSAL | Status: DC
  Administered 2020-01-20 – 2020-01-25 (×6): 15 mL via OROMUCOSAL

## 2020-01-19 MED ORDER — CHLORHEXIDINE GLUCONATE CLOTH 2 % EX PADS
6.0000 | MEDICATED_PAD | Freq: Every day | CUTANEOUS | Status: DC
  Administered 2020-01-19 – 2020-01-25 (×7): 6 via TOPICAL

## 2020-01-19 MED ORDER — SODIUM CHLORIDE 0.9% FLUSH
3.0000 mL | Freq: Two times a day (BID) | INTRAVENOUS | Status: DC
  Administered 2020-01-19 – 2020-01-25 (×13): 3 mL via INTRAVENOUS

## 2020-01-19 MED ORDER — SODIUM CHLORIDE 0.9 % IV SOLN: 250.0000 mL | INTRAVENOUS | Status: DC | PRN

## 2020-01-19 MED ORDER — COLCHICINE 0.6 MG PO TABS
0.6000 mg | ORAL_TABLET | Freq: Two times a day (BID) | ORAL | Status: DC
  Administered 2020-01-19 – 2020-01-25 (×13): 0.6 mg via ORAL
  Filled 2020-01-19 (×13): qty 1

## 2020-01-19 MED ORDER — FENTANYL CITRATE (PF) 100 MCG/2ML IJ SOLN
INTRAMUSCULAR | Status: DC | PRN
  Administered 2020-01-19: 25 ug via INTRAVENOUS

## 2020-01-19 MED ORDER — LIDOCAINE HCL (PF) 1 % IJ SOLN
INTRAMUSCULAR | Status: DC | PRN
  Administered 2020-01-19: 20 mL

## 2020-01-19 MED ORDER — LACTATED RINGERS IV BOLUS
1000.0000 mL | Freq: Once | INTRAVENOUS | Status: AC
  Administered 2020-01-19: 1000 mL via INTRAVENOUS

## 2020-01-19 MED ORDER — FENTANYL CITRATE (PF) 100 MCG/2ML IJ SOLN
25.0000 ug | INTRAMUSCULAR | Status: DC | PRN
  Administered 2020-01-19 – 2020-01-20 (×11): 50 ug via INTRAVENOUS
  Filled 2020-01-19 (×10): qty 2

## 2020-01-19 MED ORDER — LIDOCAINE HCL (PF) 1 % IJ SOLN
INTRAMUSCULAR | Status: AC
  Filled 2020-01-19: qty 30

## 2020-01-19 MED ORDER — DM-GUAIFENESIN ER 30-600 MG PO TB12
1.0000 | ORAL_TABLET | Freq: Two times a day (BID) | ORAL | Status: DC
  Administered 2020-01-19 – 2020-01-20 (×3): 1 via ORAL
  Filled 2020-01-19 (×3): qty 1

## 2020-01-19 MED ORDER — FENTANYL CITRATE (PF) 100 MCG/2ML IJ SOLN
INTRAMUSCULAR | Status: AC
  Filled 2020-01-19: qty 2

## 2020-01-19 MED ORDER — FENTANYL CITRATE (PF) 100 MCG/2ML IJ SOLN
25.0000 ug | INTRAMUSCULAR | Status: DC | PRN
  Administered 2020-01-19 (×3): 25 ug via INTRAVENOUS
  Filled 2020-01-19 (×3): qty 2

## 2020-01-19 MED ORDER — ONDANSETRON HCL 4 MG/2ML IJ SOLN
INTRAMUSCULAR | Status: AC
  Filled 2020-01-19: qty 2

## 2020-01-19 MED ORDER — HEPARIN (PORCINE) IN NACL 1000-0.9 UT/500ML-% IV SOLN
INTRAVENOUS | Status: AC
  Filled 2020-01-19: qty 500

## 2020-01-19 SURGICAL SUPPLY — 2 items
PACK CARDIAC CATHETERIZATION (CUSTOM PROCEDURE TRAY) ×2 IMPLANT
PERIVAC PERICARDIOCENTESIS 8.3 (TRAY / TRAY PROCEDURE) ×2 IMPLANT

## 2020-01-19 NOTE — Interval H&P Note (Signed)
History and Physical Interval Note:  01/19/2020 4:53 AM  Glen Scott  has presented today for surgery, with the diagnosis of STEMI.  The various methods of treatment have been discussed with the patient and family. After consideration of risks, benefits and other options for treatment, the patient has consented to  Procedure(s): PERICARDIOCENTESIS (N/A) as a surgical intervention.  The patient's history has been reviewed, patient examined, no change in status, stable for surgery.  I have reviewed the patient's chart and labs.  Questions were answered to the patient's satisfaction.     [name]

## 2020-01-19 NOTE — Progress Notes (Signed)
[facility] Progress Note Patient Name: Glen Scott DOB: 03-30-1946 MRN: [id number]   Date of Service  01/19/2020  HPI/Events of Note  New patient evaluation. 69M with history of Crohn's disease, bilateral PE (on Xarelto) and suspected lung malignancy (malignant cells on thoracentesis on 8/4 at [facility]) who is admitted with a very large pericardial effusion (symptomatic) c/b findings c/w cardiac tamponade on echo. STEMI attending has been contacted and has activated the cath lab for emergent pericardiocentesis / pericardial drain placement. He is slightly tachycardic (SR @ 100-105 bpm) but normotensive. He is dyspneic.   eICU Interventions  Cath lab activated. Plan for emergent pericardial drain in cath lab for suspected malignant pericardial effusion. If confirmed malignant effusion, will need drain converted to window by CT surgery.     Intervention Category Evaluation Type: New Patient Evaluation  Marily Lente Laine Fonner 01/19/2020, 4:05 AM

## 2020-01-19 NOTE — Progress Notes (Signed)
° ° °  Patient seen last night by Dr. Radford Pax and earlier this AM by Dr. Ellyn Hack.  Malignant pericardial effusion.  Now status post pericardiocentesis.  Plans for pericardial window likely tomorrow.   Hemodynamically stable and oxygenating well this AM.   Complains of some pain at the drain site.

## 2020-01-19 NOTE — Consult Note (Addendum)
Cardiology Consultation:   Patient ID: Wash Nienhaus MRN: [id number]; DOB: 20-Jun-1945  Admit date: 01/18/2020 Date of Consult: 01/19/2020  Primary Care Provider: Leisa Lenz, MD [facility] Cardiologist: No primary care provider on file.  [facility] Electrophysiologist:  None    Patient Profile:   Abanoub Likes is a 74 y.o. male with a hx of AAA, Crohn's disease longstanding tobacco smoker with recently diagnosed pulmonary embolus (presumably on August 6, currently on Xarelto 15 mg twice daily) as well as likely lung cancer (malignant cells seen in thoracentesis on 8 4 by oncology) who is being seen today for the evaluation of [place] at the request of Jorje Guild, MD (pccm)  History of Present Illness:   Mr. Sonoda was apparently in his usual state of health this morning when he woke up.  He had recently been started on Xarelto for bilateral PEs earlier this month.  He apparently went outside and started becoming overheated and had upper abdominal pain reoperating on his back.  He was then troubled with dyspnea and nausea and vomiting.  He went to the [facility] where imaging reviewed at enlarging pericardial effusion with likely tamponade physiology as well as increased thrombus and AAA.  He was was transferred from [facility] to [facility] ER to ER transfer due to large pericardial effusion noted on CT scan.   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  There is concern for possible leaking aneurysm or inflammatory aneurysm/mycotic aneurysm cannot rule out. Vascular surgery was contacted with no plans for immediate intervention of the AAA thrombus.   Mr. Ayuso was seen  earlier by general cardiology (Dr. Radford Pax) at 10:30 PM 8/14.  At that time is awake and alert he was noted be sinus tachycardic on exam but in no obvious distress.  He is on 5 L of oxygen.  Complaining of abdominal pain radiating to the back with dyspnea.  Dr. Radford Pax Reviewed the echocardiogram and indicated there was concern for pericardial tamponade. Per her note: "-Given high suspicion for malignant effusion with possible hemorrhagic conversion in setting of possible pericardial involvement of lung CA, recommend CVTS consult tonight for pericardial drain"  Dr. Kipp Brood from [place] was consulted via telephone.  The discussion apparently was that given concern for possible pericardial tamponade the recommendation was pericardiocentesis first prior to pericardial window because of concern for hemodynamic collapse during induction of anesthesia.  The patient was subsequently transferred to 4 N. where he is now evaluated by PCCM having talked to Dr. Kipp Brood he recommended contacting interventional cardiology.  I am not called at 3:40 in the morning to discuss the case.  He has not had a dose of Xarelto since 9 PM on August 13.  Past Medical History:  Diagnosis Date  . AAA (abdominal aortic aneurysm) ([place])   . Bilateral pleural effusion   . Cigarette smoker   . Crohn's disease ([place])   . History of pulmonary embolus (PE)   . Pericardial effusion   . Umbilical hernia     Past Surgical History:  Procedure Laterality Date  . THORACENTESIS  01/08/2020   [place], [facility]   Other surgeries unknown.  Home Medications:  Prior to Admission medications   Medication Sig Start Date End Date Taking? Authorizing  Provider  colchicine 0.6 MG tablet Take 0.6 mg by mouth 2 (two) times daily.   Yes [provider]  ipratropium-albuterol (DUONEB) 0.5-2.5 (3) MG/3ML SOLN Take 3 mLs by nebulization in the morning and at bedtime.   Yes [provider]  Rivaroxaban (XARELTO) 15 MG TABS  tablet Take 15 mg by mouth 2 (two) times daily with a meal. Started on 01-10-20. Take until 01-29-20   Yes [provider]    Inpatient Medications: Scheduled Meds: . Chlorhexidine Gluconate Cloth  6 each Topical Daily   Continuous Infusions:  PRN Meds: docusate sodium, fentaNYL (SUBLIMAZE) injection, polyethylene glycol  Allergies:    Allergies  Allergen Reactions  . Morphine And Related     "I bleed out from my pores"  . Penicillins     Social History:   Social History   Socioeconomic History  . Marital status: Single    Spouse name: Not on file  . Number of children: Not on file  . Years of education: Not on file  . Highest education level: Not on file  Occupational History  . Not on file  Tobacco Use  . Smoking status: Not on file  Substance and Sexual Activity  . Alcohol use: Not on file  . Drug use: Not on file  . Sexual activity: Not on file  Other Topics Concern  . Not on file  Social History Narrative  . Not on file   Social Determinants of Health   Financial Resource Strain:   . Difficulty of Paying Living Expenses:   Food Insecurity:   . Worried About Charity fundraiser in the Last Year:   . Arboriculturist in the Last Year:   Transportation Needs:   . Film/video editor (Medical):   Marland Kitchen Lack of Transportation (Non-Medical):   Physical Activity:   . Days of Exercise per Week:   . Minutes of Exercise per Session:   Stress:   . Feeling of Stress :   Social Connections:   . Frequency of Communication with Friends and Family:   . Frequency of Social Gatherings with Friends and Family:   . Attends Religious Services:   . Active Member of Clubs or Organizations:   . Attends Archivist Meetings:   Marland Kitchen Marital Status:   Intimate Partner Violence:   . Fear of Current or Ex-Partner:   . Emotionally Abused:   Marland Kitchen Physically Abused:   . Sexually Abused:     Family History:   Family History  Family history unknown: Yes     ROS:    Please see the history of present illness.  Please see HPI for full details.  He has not had any fevers or chills, but has had cough, abdominal pain radiating to his back with significant dyspnea.  Not necessarily noting any chest pain.  All other ROS reviewed and negative.     Physical Exam/Data:   Vitals:   01/19/20 0115 01/19/20 0131 01/19/20 0200 01/19/20 0300  BP: (!) 106/93 (!) 133/121 137/72 (!) 86/74  Pulse:  (!) 114 (!) 103 (!) 102  Resp: 19 (!) 27 (!) 26 17  Temp:      TempSrc:      SpO2:  98% 90% 100%  Weight:      Height:       No intake or output data in the 24 hours ending 01/19/20 0426 Last 3 Weights 01/18/2020  Weight (lbs) 145 lb  Weight (kg) 65.772 kg  Body mass index is 22.05 kg/m.  General: Thin, frail ill-appearing gentleman.  Borderline toxic appearing.  But not in obvious distress HEENT: [place]/AT/EOMI. Neck: +JVD with HJR Vascular: No carotid bruits; FA pulses 2+ bilaterally without bruits  Cardiac: Distant heart sounds.  Tachycardic with normal S1 and S2.  No M/R/G. Lungs: Decreased basal breath sounds, worse on the left.  Basal crackles and rhonchi with no wheezes or rales. Abd: soft, nontender, mildly distended abdomen with epigastric pain and tenderness. Ext: no edema Musculoskeletal:  No deformities, BUE and BLE strength normal and equal Skin: warm and dry  Neuro:  CNs 2-12 intact, no focal abnormalities noted Psych:  Normal affect   EKG: Sinus tachycardia, rate 112, low voltage throughout.  Nonspecific ST and T wave changes.  Borderline prolonged QT interval.  Telemetry:  Telemetry was personally reviewed and demonstrates: Sinus tachycardia with rates ranging from 105 to 112 bpm.  Relevant CV Studies: Personally reviewed 2D echo : Normal EF/hyperdynamic with EF 65 to 70%.  No R WMA.  Normal RV size.  Normal PA pressure.  Dilated IVC with <50% respiratory variability suggesting RAP of 15 mmHg.  There is a large circumferential pericardial  effusion measuring 2.79 x 3.3 cm at greatest diameter.  There is RA inversion and subtle RV diastolic collapse.  RV cavity is small.  Borderline respirophasic changes in mitral valve inflow with 50 mm difference.  These findings are worrisome for cardiac tamponade physiology.  There is also a large pleural effusion in left lateral region.  Laboratory Data:  High Sensitivity Troponin:   Recent Labs  Lab 01/18/20 2250 01/19/20 0309  TROPONINIHS 12 15     Chemistry Recent Labs  Lab 01/18/20 2250  NA 133*  K 4.5  CL 101  CO2 17*  GLUCOSE 130*  BUN 28*  CREATININE 1.64*  CALCIUM 8.4*  GFRNONAA 41*  GFRAA 48*  ANIONGAP 15    Recent Labs  Lab 01/18/20 2250  PROT 5.5*  ALBUMIN 2.8*  AST 115*  ALT 115*  ALKPHOS 73  BILITOT 1.0   Hematology Recent Labs  Lab 01/18/20 2250  WBC 16.9*  RBC 3.82*  HGB 11.1*  HCT 37.1*  MCV 97.1  MCH 29.1  MCHC 29.9*  RDW 13.4  PLT 251   BNPNo results for input(s): BNP, PROBNP in the last 168 hours.  DDimer No results for input(s): DDIMER in the last 168 hours.  Venous lactate 3.1 indicative of acidosis.   Radiology/Studies:  [facility] Chest Port 1 View  Result Date: 01/18/2020 CLINICAL DATA:  Chest pain and shortness of breath. EXAM: PORTABLE CHEST 1 VIEW COMPARISON:  None. FINDINGS: Mild atelectasis and/or infiltrate is seen within the retrocardiac region of the left lung base. There is a small left pleural effusion. No pneumothorax is identified. The cardiac silhouette is markedly enlarged. There is mild calcification of the aortic arch. The visualized skeletal structures are unremarkable. IMPRESSION: 1. Mild left basilar atelectasis and/or infiltrate. 2. Small left pleural effusion. Electronically Signed   By: Virgina Norfolk M.D.   On: 01/18/2020 22:24   ECHOCARDIOGRAM COMPLETE  Result Date: 01/18/2020    ECHOCARDIOGRAM REPORT   Patient Name:   Aiyden Tedeschi Date of Exam: 01/18/2020 Medical Rec #:  [id number]  Height:       68.0 in  Accession #:    [id number] Weight:       145.0 lb Date of Birth:  1945/09/25  BSA:          1.783 m Patient  Age:    45 years   BP:           103/80 mmHg Patient Gender: M          HR:           107 bpm. Exam Location:  Inpatient Procedure: 2D Echo, 3D Echo, Color Doppler and Cardiac Doppler STAT ECHO Indications:    I31.3 Pericardial effusion (noninflammatory)  History:        Patient has no prior history of Echocardiogram examinations.                 Risk Factors:Current Smoker.  Sonographer:    Raquel Sarna Senior RDCS Referring Phys: [place]  Sonographer Comments: Respirometer not tracking well, labels made by sonographer observation. IMPRESSIONS  1. Left ventricular ejection fraction, by estimation, is 65 to 70%. The left ventricle has normal function. The left ventricle has no regional wall motion abnormalities. Left ventricular diastolic function could not be evaluated.  2. Right ventricular systolic function is normal. The right ventricular size is normal. There is normal pulmonary artery systolic pressure. The estimated right ventricular systolic pressure is 17.6 mmHg.  3. The mitral valve is normal in structure. No evidence of mitral valve regurgitation. No evidence of mitral stenosis.  4. The aortic valve is normal in structure. Aortic valve regurgitation is not visualized. No aortic stenosis is present.  5. The inferior vena cava is dilated in size with <50% respiratory variability, suggesting right atrial pressure of 15 mmHg.  6. Larger circumferential pericardial effusion measuring 2.79 x 3.33cm at greatest diameter. There is RA inversion. Possible subtle RV diastolic collapse. The RV cavity is small. Borderline respirophasic changes in MV inflow velocities with a 52mmHg difference. The IVC is dilated with < 50% respirophasic change. Findings worrisome for cardiac tamponade. . Large pericardial effusion. The pericardial effusion is circumferential. Moderate pleural effusion in the left lateral  region. FINDINGS  Left Ventricle: Left ventricular ejection fraction, by estimation, is 65 to 70%. The left ventricle has normal function. The left ventricle has no regional wall motion abnormalities. The left ventricular internal cavity size was normal in size. There is  no left ventricular hypertrophy. Left ventricular diastolic function could not be evaluated. Right Ventricle: The right ventricular size is normal. No increase in right ventricular wall thickness. Right ventricular systolic function is normal. There is normal pulmonary artery systolic pressure. The tricuspid regurgitant velocity is 2.05 m/s, and  with an assumed right atrial pressure of 15 mmHg, the estimated right ventricular systolic pressure is 16.0 mmHg. Left Atrium: Left atrial size was normal in size. Right Atrium: Right atrial size was normal in size. Pericardium: Larger circumferential pericardial effusion measuring 2.79 x 3.33cm at greatest diameter. There is RA inversion. Possible subtle RV diastolic collapse. The RV cavity is small. Borderline respirophasic changes in MV inflow velocities with a 35mmHg difference. The IVC is dilated with < 50% respirophasic change. Findings worrisome for cardiac tamponade. A large pericardial effusion is present. The pericardial effusion is circumferential. Mitral Valve: The mitral valve is normal in structure. Normal mobility of the mitral valve leaflets. No evidence of mitral valve regurgitation. No evidence of mitral valve stenosis. Tricuspid Valve: The tricuspid valve is normal in structure. Tricuspid valve regurgitation is trivial. No evidence of tricuspid stenosis. Aortic Valve: The aortic valve is normal in structure. Aortic valve regurgitation is not visualized. No aortic stenosis is present. Pulmonic Valve: The pulmonic valve was normal in structure. Pulmonic valve regurgitation is not visualized. No  evidence of pulmonic stenosis. Aorta: The aortic root is normal in size and structure. Venous:  The inferior vena cava is dilated in size with less than 50% respiratory variability, suggesting right atrial pressure of 15 mmHg. IAS/Shunts: No atrial level shunt detected by color flow Doppler. Additional Comments: There is a moderate pleural effusion in the left lateral region.  RIGHT VENTRICLE RV S prime:     12.10 cm/s TAPSE (M-mode): 1.8 cm TRICUSPID VALVE TR Peak grad:   16.8 mmHg TR Vmax:        205.00 cm/s Fransico Him MD Electronically signed by Fransico Him MD Signature Date/Time: 01/18/2020/11:07:22 PM    Final    {  Assessment and Plan:   Principal Problem:   Pericardial effusion with cardiac tamponade Active Problems:   Bilateral pulmonary embolism (HCC)   Pleural effusion, left   AAA (abdominal aortic aneurysm) without rupture ([place])  This is a difficult situation with patient has pending cardiovascular collapse with elevated lactate level, persistent tachycardia but is maintaining stable blood pressures intermittently.  There is definitely room for IV hydration.  The concern is that the patient has high likelihood of decompensation given his level of presentation. He is on colchicine treatment dose for pericarditis -would continue  While I feel that pericardial window would be the best option for him long-term, there is a very large pericardial effusion which should be approachable from a subxiphoid approach for paracentesis.  Since he is not an extremis, this is probably best done in the cardiac catheterization lab with the team onsite.  Unfortunately do not have the option of having bedside echo upon completion of the procedure.  This will need to be done first thing in the morning.  After discussion with the PCCM team, they are concerned about possible hemodynamic collapse and would prefer to avoid waiting any longer until he becomes even sicker.  It does make sense to move expeditiously in light of his large effusion and an high likelihood of decompensation.  He will need to be  transferred to 2 H CVICU post procedure.  We will plan to call in the cardiac catheterization team for emergent pericardiocentesis.  I called [facility].  Procedure discussed with the patient - consent obtained.  Hold Xarelto for at least 24 hours post procedure or until drainage has stopped.   For questions or updates, please contact [place] Please consult [email or web address] for contact info under    Signed, [name], MD  01/19/2020 4:26 AM

## 2020-01-19 NOTE — Progress Notes (Signed)
Patient was desatting into the 70's on 5L [place], he would rebound back into the 90's occasionally then back down to 70.  Tried multiple pulse ox's and got the same result.  Placed patient on non-rebreather at 15L.  O2 sat now at 98, MD made aware, will continue to monitor.

## 2020-01-19 NOTE — Progress Notes (Addendum)
NAME:  Glen Scott, MRN:  [id number], DOB:  [birth year], LOS: 1 ADMISSION DATE:  01/18/2020, CONSULTATION DATE:  01/19/20 REFERRING MD:  EDP, CHIEF COMPLAINT:  Dyspnea and abdominal pain   Brief History   74 y.o. M with PMH of recently diagnosed malignant cells on thoracentesis at the [facility] on 01/08/20, Colitis, bilateral PE on xarelto, AAA who developed dyspnea, abdominal pain, and n/v this morning, so presented to [facility] where CT abd/pelvis showed worsening thrombus in known AAA with paricardial effusion and possible tamponade.  Accepted in transfer to the ED where PCCM was consulted  History of present illness   Glen Scott is a 74 y.o. M with PMH of Crohn's Disease, tobacco use, recent diagnosis of probable lung Ca at the [facility] (malignant cells by thoracentesis on 8/4 Oncology follow up is pending), AAA and bilateral PE's on Xarelto who woke up this morning feeling at baseline.  States he went outside and became overheated with increased upper abdominal pain that wraps around to his mid-back, dyspnea, nausea and vomiting.  He presented to the [place] in [place] where imaging showed likely enlarging pericardial effusion with tamponade physiology and increased thrombus in known AAA.  Pt was transferred to [facility] ED for specialist services.    At the time of exam, all studies pending. Pt is awake and alert, tachycardic and requiring 5L [place] O2.  He complains of abdominal and mid-back pain and shortness of breath.  Blood pressure is stable.  Cardiology has been consulted and PCCM asked to admit  Past Medical History   has a past medical history of AAA (abdominal aortic aneurysm) ([place]), Bilateral pleural effusion, Cigarette smoker, Crohn's disease ([place]), History of pulmonary embolus (PE), Pericardial effusion, and Umbilical hernia.   Significant Hospital Events   8/14 Transfer from [place] to [facility] [place] admit 8/15 Pericardial drain placement  Consults:  Cardiology CTS  Procedures:     Significant Diagnostic Tests:  8/14 CXR>>Mild left basilar atelectasis and/or infiltrate.  Small left pleural effusion.  Micro Data:  8/14 Sars-CoV-2>>  Antimicrobials:     Interim history/subjective:  S/p pericardial drain. HDS. Denies pain.  Objective   Blood pressure (!) 117/59, pulse 91, temperature 97.7 F (36.5 C), temperature source Oral, resp. rate 17, height 5\' 8"  (1.727 m), weight 67.7 kg, SpO2 99 %.        Intake/Output Summary (Last 24 hours) at 01/19/2020 1622 Last data filed at 01/19/2020 0837 Gross per 24 hour  Intake 0 ml  Output 300 ml  Net -300 ml   Filed Weights   01/18/20 2000 01/19/20 0550  Weight: 65.8 kg 67.7 kg   Physical Exam: General: Thin elderly-appearing, no acute distress HENT: [place], AT, OP clear, MMM Eyes: EOMI, no scleral icterus Respiratory: Clear to auscultation bilaterally.  No crackles, wheezing or rales Cardiovascular: RRR, -M/R/G, no JVD, pericardial drain with serosanguinous fluid drainage GI: BS+, soft, nontender Extremities:-Edema,-tenderness Neuro: AAO x4, CNII-XII grossly intact  Resolved Hospital Problem list     Assessment & Plan:   Malignant pericardial effusion with tamponade s/p pericardial drain 8/14 Glen Scott currently being worked up at the [place] in [place] for probable lung cancer (+malignant pleural effusion with spiculated lung mass). Imaging not available for review. -Drain management per Cardiology   -Pericardial window scheduled for tomorrow with CTS -Will need to continue outpatient oncology work-up at [facility] on discharge  Recent bilateral PE -Holding Xarelto for procedure  AAA with increasing thrombus PCCM attending spoke with vascular on call on  admission -no emergent intervention needed overnight -resume anti-coagulation as able  Acute hypoxemic respiratory failure secondary to above - improving on my exam -Supplemental oxygen to maintain sats >88%  Best practice:  Diet: NPO at  midnight Pain/Anxiety/Delirium protocol (if indicated): prn fentanyl VAP protocol (if indicated): n/a DVT prophylaxis: SCD's GI prophylaxis: n/a Glucose control: SSI Mobility: bed rest Code Status: full code Family Communication: Updated Glen Scott at bedside on 8/15 Disposition: ICU  Labs   CBC: Recent Labs  Lab 01/18/20 2250  WBC 16.9*  HGB 11.1*  HCT 37.1*  MCV 97.1  PLT 680    Basic Metabolic Panel: Recent Labs  Lab 01/18/20 2250  NA 133*  K 4.5  CL 101  CO2 17*  GLUCOSE 130*  BUN 28*  CREATININE 1.64*  CALCIUM 8.4*   GFR: Estimated Creatinine Clearance: 39 mL/min (A) (by C-G formula based on SCr of 1.64 mg/dL (H)). Recent Labs  Lab 01/18/20 2249 01/18/20 2250 01/19/20 0309  WBC  --  16.9*  --   LATICACIDVEN 3.1*  --  2.9*    Liver Function Tests: Recent Labs  Lab 01/18/20 2250  AST 115*  ALT 115*  ALKPHOS 73  BILITOT 1.0  PROT 5.5*  ALBUMIN 2.8*   No results for input(s): LIPASE, AMYLASE in the last 168 hours. No results for input(s): AMMONIA in the last 168 hours.  ABG No results found for: PHART, PCO2ART, PO2ART, HCO3, TCO2, ACIDBASEDEF, O2SAT   Coagulation Profile: Recent Labs  Lab 01/18/20 2250  INR 1.6*    Cardiac Enzymes: No results for input(s): CKTOTAL, CKMB, CKMBINDEX, TROPONINI in the last 168 hours.  HbA1C: No results found for: HGBA1C  CBG: No results for input(s): GLUCAP in the last 168 hours.  The Glen Scott is critically ill with multiple organ systems failure and requires high complexity decision making for assessment and support, frequent evaluation and titration of therapies, application of advanced monitoring technologies and extensive interpretation of multiple databases.  Independent Critical Care Time: 36 Minutes.   Rodman Pickle, M.D. [facility] Pulmonary/Critical Care Medicine 01/19/2020 4:23 PM   Please see Amion for pager number to reach on-call Pulmonary and Critical Care Team.

## 2020-01-19 NOTE — Consult Note (Signed)
[address]       [place] [address]             [phone]        [facility] [place] Medical Record #[id number] Date of Birth: 1946-01-20  Referring: No ref. provider found Primary Care: Leisa Lenz, MD Primary Cardiologist:No primary care provider on file.  Chief Complaint:    Chief Complaint  Patient presents with  . Shortness of Breath    History of Present Illness:     74 year old male with a history of a malignant effusion post discovered on August 4 of this year presents to the hospital with progressive shortness of breath, and a large pericardial effusion with tamponade physiology.  Patient states that since January of this year he has been in poor health.  He was diagnosed with bilateral pulmonary emboli earlier this month and has been placed on Xarelto.  He is also being evaluated by the [facility] hospital and was transferred to [facility] due to the pericardial effusion.  He recently underwent a pericardial drain placement this morning and states that he is already breathing better.  Past Medical and Surgical History: Previous Chest Surgery: No Previous Chest Radiation: No Creatinine: 1.64  Past Medical History:  Diagnosis Date  . AAA (abdominal aortic aneurysm) ([place])   . Bilateral pleural effusion   . Cigarette smoker   . Crohn's disease ([place])   . History of pulmonary embolus (PE)   . Pericardial effusion   . Umbilical hernia     Past Surgical History:  Procedure Laterality Date  . THORACENTESIS  01/08/2020   [place], [facility]    Social History:   Social History   Tobacco Use  Smoking Status Not on file    Social History   Substance and Sexual Activity  Alcohol Use None     Allergies  Allergen Reactions  . Morphine And Related     "I bleed out from my pores"  . Penicillins     Medications:  Anti-Coagulation: Taking Xarelto.  Last dose 8/13  Current Facility-Administered Medications  Medication Dose  Route Frequency Provider Last Rate Last Admin  . 0.9 %  sodium chloride infusion  250 mL Intravenous PRN Leonie Man, MD      . Chlorhexidine Gluconate Cloth 2 % PADS 6 each  6 each Topical Daily Leonie Man, MD   6 each at 01/19/20 0150  . colchicine tablet 0.6 mg  0.6 mg Oral BID Leonie Man, MD      . docusate sodium (COLACE) capsule 100 mg  100 mg Oral BID PRN Leonie Man, MD      . fentaNYL (SUBLIMAZE) injection 25 mcg  25 mcg Intravenous Q2H PRN Leonie Man, MD   25 mcg at 01/19/20 0438  . ondansetron (ZOFRAN) injection 4 mg  4 mg Intravenous Q6H PRN Leonie Man, MD      . polyethylene glycol [facility] Floria Raveling) packet 17 g  17 g Oral Daily PRN Leonie Man, MD      . sodium chloride flush (NS) 0.9 % injection 3 mL  3 mL Intravenous Q12H Leonie Man, MD      . sodium chloride flush (NS) 0.9 % injection 3 mL  3 mL Intravenous PRN Leonie Man, MD        Medications Prior to Admission  Medication Sig Dispense Refill Last Dose  . colchicine 0.6 MG tablet Take  0.6 mg by mouth 2 (two) times daily.   01/17/2020 at Unknown time  . ipratropium-albuterol (DUONEB) 0.5-2.5 (3) MG/3ML SOLN Take 3 mLs by nebulization in the morning and at bedtime.   01/17/2020 at Unknown time  . Rivaroxaban (XARELTO) 15 MG TABS tablet Take 15 mg by mouth 2 (two) times daily with a meal. Started on 01-10-20. Take until 01-29-20   01/17/2020 at 9p    Family History  Family history unknown: Yes     Review of Systems:   Review of Systems  Respiratory: Positive for cough and shortness of breath.       Physical Exam: BP (!) 131/93 (BP Location: Left Arm)   Pulse 92   Temp (!) 96.3 F (35.7 C) (Axillary)   Resp (!) 22   Ht 5\' 8"  (1.727 m)   Wt 67.7 kg   SpO2 100%   BMI 22.69 kg/m  Physical Exam Constitutional:      General: He is not in acute distress.    Appearance: He is well-developed. He is ill-appearing.  Eyes:     Extraocular Movements: Extraocular  movements intact.  Cardiovascular:     Rate and Rhythm: Normal rate and regular rhythm.  Pulmonary:     Effort: Pulmonary effort is normal. No tachypnea.  Abdominal:     Palpations: Abdomen is soft.  Skin:    General: Skin is warm and dry.  Neurological:     Mental Status: He is alert.   Pericardial drain in place with serosanguineous output.   Diagnostic Studies & Laboratory data:     Echo: 1. Left ventricular ejection fraction, by estimation, is 65 to 70%. The  left ventricle has normal function. The left ventricle has no regional  wall motion abnormalities. Left ventricular diastolic function could not  be evaluated.  2. Right ventricular systolic function is normal. The right ventricular  size is normal. There is normal pulmonary artery systolic pressure. The  estimated right ventricular systolic pressure is 41.7 mmHg.  3. The mitral valve is normal in structure. No evidence of mitral valve  regurgitation. No evidence of mitral stenosis.  4. The aortic valve is normal in structure. Aortic valve regurgitation is  not visualized. No aortic stenosis is present.  5. The inferior vena cava is dilated in size with <50% respiratory  variability, suggesting right atrial pressure of 15 mmHg.  6. Larger circumferential pericardial effusion measuring 2.79 x 3.33cm at  greatest diameter. There is RA inversion. Possible subtle RV diastolic  collapse. The RV cavity is small. Borderline respirophasic changes in MV  inflow velocities with a 100mmHg  difference. The IVC is dilated with < 50% respirophasic change. Findings  worrisome for cardiac tamponade. . Large pericardial effusion. The  pericardial effusion is circumferential. Moderate pleural effusion in the  left lateral region.    I have independently reviewed the above radiologic studies and discussed with the patient   Recent Lab Findings: Lab Results  Component Value Date   WBC 16.9 (H) 01/18/2020   HGB 11.1 (L)  01/18/2020   HCT 37.1 (L) 01/18/2020   PLT 251 01/18/2020   GLUCOSE 130 (H) 01/18/2020   ALT 115 (H) 01/18/2020   AST 115 (H) 01/18/2020   NA 133 (L) 01/18/2020   K 4.5 01/18/2020   CL 101 01/18/2020   CREATININE 1.64 (H) 01/18/2020   BUN 28 (H) 01/18/2020   CO2 17 (L) 01/18/2020   INR 1.6 (H) 01/18/2020      Assessment / Plan:  74 year old male that presents with a large pericardial effusion in the setting of a likely malignant primary lung cancer given history of for malignant effusion.  He is undergone a successful pericardial drain placement by cardiology.  He will require definitive pericardial window which will be performed on 01/20/2020 pending correction of his kidney and renal function.  We will continue to follow.     I  spent 40 minutes counseling the patient face to face.   Lajuana Matte 01/19/2020 6:05 AM

## 2020-01-19 NOTE — H&P (View-Only) (Signed)
Cardiology Consultation:   Patient ID: Loreto Loescher MRN: [id number]; DOB: 09-20-1945  Admit date: 01/18/2020 Date of Consult: 01/19/2020  Primary Care Provider: Leisa Lenz, MD [facility] Cardiologist: No primary care provider on file.  [facility] Electrophysiologist:  None    Patient Profile:   Ayinde Yakel is a 74 y.o. male with a hx of AAA, Crohn's disease longstanding tobacco smoker with recently diagnosed pulmonary embolus (presumably on August 6, currently on Xarelto 15 mg twice daily) as well as likely lung cancer (malignant cells seen in thoracentesis on 8 4 by oncology) who is being seen today for the evaluation of [place] at the request of Jorje Guild, MD (pccm)  History of Present Illness:   Mr. Donath was apparently in his usual state of health this morning when he woke up.  He had recently been started on Xarelto for bilateral PEs earlier this month.  He apparently went outside and started becoming overheated and had upper abdominal pain reoperating on his back.  He was then troubled with dyspnea and nausea and vomiting.  He went to the [facility] where imaging reviewed at enlarging pericardial effusion with likely tamponade physiology as well as increased thrombus and AAA.  He was was transferred from [facility] to [facility] ER to ER transfer due to large pericardial effusion noted on CT scan.   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  There is concern for possible leaking aneurysm or inflammatory aneurysm/mycotic aneurysm cannot rule out. Vascular surgery was contacted with no plans for immediate intervention of the AAA thrombus.   Mr. Penton was seen  earlier by general cardiology (Dr. Radford Pax) at 10:30 PM 8/14.  At that time is awake and alert he was noted be sinus tachycardic on exam but in no obvious distress.  He is on 5 L of oxygen.  Complaining of abdominal pain radiating to the back with dyspnea.  Dr. Radford Pax Reviewed the echocardiogram and indicated there was concern for pericardial tamponade. Per her note: "-Given high suspicion for malignant effusion with possible hemorrhagic conversion in setting of possible pericardial involvement of lung CA, recommend CVTS consult tonight for pericardial drain"  Dr. Kipp Brood from [place] was consulted via telephone.  The discussion apparently was that given concern for possible pericardial tamponade the recommendation was pericardiocentesis first prior to pericardial window because of concern for hemodynamic collapse during induction of anesthesia.  The patient was subsequently transferred to [facility] [address]. where he is now evaluated by PCCM having talked to Dr. Kipp Brood he recommended contacting interventional cardiology.  I am not called at 3:40 in the morning to discuss the case.  He has not had a dose of Xarelto since 9 PM on August 13.  Past Medical History:  Diagnosis Date  . AAA (abdominal aortic aneurysm) ([place])   . Bilateral pleural effusion   . Cigarette smoker   . Crohn's disease ([place])   . History of pulmonary embolus (PE)   . Pericardial effusion   . Umbilical hernia     Past Surgical History:  Procedure Laterality Date  . THORACENTESIS  01/08/2020   [place], [facility]   Other surgeries unknown.  Home Medications:  Prior to Admission medications   Medication Sig Start Date End Date Taking? Authorizing  Provider  colchicine 0.6 MG tablet Take 0.6 mg by mouth 2 (two) times daily.   Yes [provider]  ipratropium-albuterol (DUONEB) 0.5-2.5 (3) MG/3ML SOLN Take 3 mLs by nebulization in the morning and at bedtime.   Yes [provider]  Rivaroxaban (XARELTO) 15 MG TABS  tablet Take 15 mg by mouth 2 (two) times daily with a meal. Started on 01-10-20. Take until 01-29-20   Yes [provider]    Inpatient Medications: Scheduled Meds: . Chlorhexidine Gluconate Cloth  6 each Topical Daily   Continuous Infusions:  PRN Meds: docusate sodium, fentaNYL (SUBLIMAZE) injection, polyethylene glycol  Allergies:    Allergies  Allergen Reactions  . Morphine And Related     "I bleed out from my pores"  . Penicillins     Social History:   Social History   Socioeconomic History  . Marital status: Single    Spouse name: Not on file  . Number of children: Not on file  . Years of education: Not on file  . Highest education level: Not on file  Occupational History  . Not on file  Tobacco Use  . Smoking status: Not on file  Substance and Sexual Activity  . Alcohol use: Not on file  . Drug use: Not on file  . Sexual activity: Not on file  Other Topics Concern  . Not on file  Social History Narrative  . Not on file   Social Determinants of Health   Financial Resource Strain:   . Difficulty of Paying Living Expenses:   Food Insecurity:   . Worried About Charity fundraiser in the Last Year:   . Arboriculturist in the Last Year:   Transportation Needs:   . Film/video editor (Medical):   Marland Kitchen Lack of Transportation (Non-Medical):   Physical Activity:   . Days of Exercise per Week:   . Minutes of Exercise per Session:   Stress:   . Feeling of Stress :   Social Connections:   . Frequency of Communication with Friends and Family:   . Frequency of Social Gatherings with Friends and Family:   . Attends Religious Services:   . Active Member of Clubs or Organizations:   . Attends Archivist Meetings:   Marland Kitchen Marital Status:   Intimate Partner Violence:   . Fear of Current or Ex-Partner:   . Emotionally Abused:   Marland Kitchen Physically Abused:   . Sexually Abused:     Family History:   Family History  Family history unknown: Yes     ROS:    Please see the history of present illness.  Please see HPI for full details.  He has not had any fevers or chills, but has had cough, abdominal pain radiating to his back with significant dyspnea.  Not necessarily noting any chest pain.  All other ROS reviewed and negative.     Physical Exam/Data:   Vitals:   01/19/20 0115 01/19/20 0131 01/19/20 0200 01/19/20 0300  BP: (!) 106/93 (!) 133/121 137/72 (!) 86/74  Pulse:  (!) 114 (!) 103 (!) 102  Resp: 19 (!) 27 (!) 26 17  Temp:      TempSrc:      SpO2:  98% 90% 100%  Weight:      Height:       No intake or output data in the 24 hours ending 01/19/20 0426 Last 3 Weights 01/18/2020  Weight (lbs) 145 lb  Weight (kg) 65.772 kg  Body mass index is 22.05 kg/m.  General: Thin, frail ill-appearing gentleman.  Borderline toxic appearing.  But not in obvious distress HEENT: [place]/AT/EOMI. Neck: +JVD with HJR Vascular: No carotid bruits; FA pulses 2+ bilaterally without bruits  Cardiac: Distant heart sounds.  Tachycardic with normal S1 and S2.  No M/R/G. Lungs: Decreased basal breath sounds, worse on the left.  Basal crackles and rhonchi with no wheezes or rales. Abd: soft, nontender, mildly distended abdomen with epigastric pain and tenderness. Ext: no edema Musculoskeletal:  No deformities, BUE and BLE strength normal and equal Skin: warm and dry  Neuro:  CNs 2-12 intact, no focal abnormalities noted Psych:  Normal affect   EKG: Sinus tachycardia, rate 112, low voltage throughout.  Nonspecific ST and T wave changes.  Borderline prolonged QT interval.  Telemetry:  Telemetry was personally reviewed and demonstrates: Sinus tachycardia with rates ranging from 105 to 112 bpm.  Relevant CV Studies: Personally reviewed 2D echo : Normal EF/hyperdynamic with EF 65 to 70%.  No R WMA.  Normal RV size.  Normal PA pressure.  Dilated IVC with <50% respiratory variability suggesting RAP of 15 mmHg.  There is a large circumferential pericardial  effusion measuring 2.79 x 3.3 cm at greatest diameter.  There is RA inversion and subtle RV diastolic collapse.  RV cavity is small.  Borderline respirophasic changes in mitral valve inflow with 50 mm difference.  These findings are worrisome for cardiac tamponade physiology.  There is also a large pleural effusion in left lateral region.  Laboratory Data:  High Sensitivity Troponin:   Recent Labs  Lab 01/18/20 2250 01/19/20 0309  TROPONINIHS 12 15     Chemistry Recent Labs  Lab 01/18/20 2250  NA 133*  K 4.5  CL 101  CO2 17*  GLUCOSE 130*  BUN 28*  CREATININE 1.64*  CALCIUM 8.4*  GFRNONAA 41*  GFRAA 48*  ANIONGAP 15    Recent Labs  Lab 01/18/20 2250  PROT 5.5*  ALBUMIN 2.8*  AST 115*  ALT 115*  ALKPHOS 73  BILITOT 1.0   Hematology Recent Labs  Lab 01/18/20 2250  WBC 16.9*  RBC 3.82*  HGB 11.1*  HCT 37.1*  MCV 97.1  MCH 29.1  MCHC 29.9*  RDW 13.4  PLT 251   BNPNo results for input(s): BNP, PROBNP in the last 168 hours.  DDimer No results for input(s): DDIMER in the last 168 hours.  Venous lactate 3.1 indicative of acidosis.   Radiology/Studies:  [facility] Chest Port 1 View  Result Date: 01/18/2020 CLINICAL DATA:  Chest pain and shortness of breath. EXAM: PORTABLE CHEST 1 VIEW COMPARISON:  None. FINDINGS: Mild atelectasis and/or infiltrate is seen within the retrocardiac region of the left lung base. There is a small left pleural effusion. No pneumothorax is identified. The cardiac silhouette is markedly enlarged. There is mild calcification of the aortic arch. The visualized skeletal structures are unremarkable. IMPRESSION: 1. Mild left basilar atelectasis and/or infiltrate. 2. Small left pleural effusion. Electronically Signed   By: Virgina Norfolk M.D.   On: 01/18/2020 22:24   ECHOCARDIOGRAM COMPLETE  Result Date: 01/18/2020    ECHOCARDIOGRAM REPORT   Patient Name:   Sharon Chenoweth Date of Exam: 01/18/2020 Medical Rec #:  [id number]  Height:       68.0 in  Accession #:    [id number] Weight:       145.0 lb Date of Birth:  February 25, 1946  BSA:          1.783 m Patient  Age:    65 years   BP:           103/80 mmHg Patient Gender: M          HR:           107 bpm. Exam Location:  Inpatient Procedure: 2D Echo, 3D Echo, Color Doppler and Cardiac Doppler STAT ECHO Indications:    I31.3 Pericardial effusion (noninflammatory)  History:        Patient has no prior history of Echocardiogram examinations.                 Risk Factors:Current Smoker.  Sonographer:    Raquel Sarna Senior RDCS Referring Phys: [place]  Sonographer Comments: Respirometer not tracking well, labels made by sonographer observation. IMPRESSIONS  1. Left ventricular ejection fraction, by estimation, is 65 to 70%. The left ventricle has normal function. The left ventricle has no regional wall motion abnormalities. Left ventricular diastolic function could not be evaluated.  2. Right ventricular systolic function is normal. The right ventricular size is normal. There is normal pulmonary artery systolic pressure. The estimated right ventricular systolic pressure is 82.4 mmHg.  3. The mitral valve is normal in structure. No evidence of mitral valve regurgitation. No evidence of mitral stenosis.  4. The aortic valve is normal in structure. Aortic valve regurgitation is not visualized. No aortic stenosis is present.  5. The inferior vena cava is dilated in size with <50% respiratory variability, suggesting right atrial pressure of 15 mmHg.  6. Larger circumferential pericardial effusion measuring 2.79 x 3.33cm at greatest diameter. There is RA inversion. Possible subtle RV diastolic collapse. The RV cavity is small. Borderline respirophasic changes in MV inflow velocities with a 42mmHg difference. The IVC is dilated with < 50% respirophasic change. Findings worrisome for cardiac tamponade. . Large pericardial effusion. The pericardial effusion is circumferential. Moderate pleural effusion in the left lateral  region. FINDINGS  Left Ventricle: Left ventricular ejection fraction, by estimation, is 65 to 70%. The left ventricle has normal function. The left ventricle has no regional wall motion abnormalities. The left ventricular internal cavity size was normal in size. There is  no left ventricular hypertrophy. Left ventricular diastolic function could not be evaluated. Right Ventricle: The right ventricular size is normal. No increase in right ventricular wall thickness. Right ventricular systolic function is normal. There is normal pulmonary artery systolic pressure. The tricuspid regurgitant velocity is 2.05 m/s, and  with an assumed right atrial pressure of 15 mmHg, the estimated right ventricular systolic pressure is 23.5 mmHg. Left Atrium: Left atrial size was normal in size. Right Atrium: Right atrial size was normal in size. Pericardium: Larger circumferential pericardial effusion measuring 2.79 x 3.33cm at greatest diameter. There is RA inversion. Possible subtle RV diastolic collapse. The RV cavity is small. Borderline respirophasic changes in MV inflow velocities with a 29mmHg difference. The IVC is dilated with < 50% respirophasic change. Findings worrisome for cardiac tamponade. A large pericardial effusion is present. The pericardial effusion is circumferential. Mitral Valve: The mitral valve is normal in structure. Normal mobility of the mitral valve leaflets. No evidence of mitral valve regurgitation. No evidence of mitral valve stenosis. Tricuspid Valve: The tricuspid valve is normal in structure. Tricuspid valve regurgitation is trivial. No evidence of tricuspid stenosis. Aortic Valve: The aortic valve is normal in structure. Aortic valve regurgitation is not visualized. No aortic stenosis is present. Pulmonic Valve: The pulmonic valve was normal in structure. Pulmonic valve regurgitation is not visualized. No  evidence of pulmonic stenosis. Aorta: The aortic root is normal in size and structure. Venous:  The inferior vena cava is dilated in size with less than 50% respiratory variability, suggesting right atrial pressure of 15 mmHg. IAS/Shunts: No atrial level shunt detected by color flow Doppler. Additional Comments: There is a moderate pleural effusion in the left lateral region.  RIGHT VENTRICLE RV S prime:     12.10 cm/s TAPSE (M-mode): 1.8 cm TRICUSPID VALVE TR Peak grad:   16.8 mmHg TR Vmax:        205.00 cm/s Fransico Him MD Electronically signed by Fransico Him MD Signature Date/Time: 01/18/2020/11:07:22 PM    Final    {  Assessment and Plan:   Principal Problem:   Pericardial effusion with cardiac tamponade Active Problems:   Bilateral pulmonary embolism (HCC)   Pleural effusion, left   AAA (abdominal aortic aneurysm) without rupture ([place])  This is a difficult situation with patient has pending cardiovascular collapse with elevated lactate level, persistent tachycardia but is maintaining stable blood pressures intermittently.  There is definitely room for IV hydration.  The concern is that the patient has high likelihood of decompensation given his level of presentation. He is on colchicine treatment dose for pericarditis -would continue  While I feel that pericardial window would be the best option for him long-term, there is a very large pericardial effusion which should be approachable from a subxiphoid approach for paracentesis.  Since he is not an extremis, this is probably best done in the cardiac catheterization lab with the team onsite.  Unfortunately do not have the option of having bedside echo upon completion of the procedure.  This will need to be done first thing in the morning.  After discussion with the PCCM team, they are concerned about possible hemodynamic collapse and would prefer to avoid waiting any longer until he becomes even sicker.  It does make sense to move expeditiously in light of his large effusion and an high likelihood of decompensation.  He will need to be  transferred to 2 H CVICU post procedure.  We will plan to call in the cardiac catheterization team for emergent pericardiocentesis.  I called [facility].  Procedure discussed with the patient - consent obtained.  Hold Xarelto for 12 hours post procedure.  For questions or updates, please contact [place] Please consult [email or web address] for contact info under    Signed, [name], MD  01/19/2020 4:26 AM

## 2020-01-19 NOTE — Progress Notes (Signed)
  Echocardiogram 2D Echocardiogram has been performed.  Michiel Cowboy 01/19/2020, 1:44 PM

## 2020-01-19 NOTE — Progress Notes (Signed)
At the request of the pt, I participated in a phone call with him and his daughter. Pt and family are frustrated with the care received by the [facility] system and requesting that pt be set up with oncology and f/u services using [facility] insurance that is carried by the pt. Case management consult placed.

## 2020-01-20 ENCOUNTER — Other Ambulatory Visit: Payer: Self-pay

## 2020-01-20 ENCOUNTER — Encounter (HOSPITAL_COMMUNITY)
Admission: EM | Disposition: A | Discharge status: Home / Self Care | Payer: Self-pay | Source: Home / Self Care | Attending: Internal Medicine

## 2020-01-20 ENCOUNTER — Encounter (HOSPITAL_COMMUNITY): Payer: Self-pay | Admitting: Cardiology

## 2020-01-20 ENCOUNTER — Inpatient Hospital Stay (HOSPITAL_COMMUNITY): Payer: No Typology Code available for payment source

## 2020-01-20 ENCOUNTER — Inpatient Hospital Stay (HOSPITAL_COMMUNITY): Payer: No Typology Code available for payment source | Admitting: Anesthesiology

## 2020-01-20 DIAGNOSIS — I313 Pericardial effusion (noninflammatory): Secondary | ICD-10-CM | POA: Diagnosis not present

## 2020-01-20 DIAGNOSIS — I314 Cardiac tamponade: Secondary | ICD-10-CM | POA: Diagnosis not present

## 2020-01-20 HISTORY — PX: XI ROBOTIC ASSISTED PERICARDIAL WINDOW: SHX6870

## 2020-01-20 LAB — LD, BODY FLUID (OTHER): LD, Body Fluid: 2775 IU/L

## 2020-01-20 LAB — CBC
HCT: 31.3 % — ABNORMAL LOW (ref 39.0–52.0)
Hemoglobin: 10.1 g/dL — ABNORMAL LOW (ref 13.0–17.0)
MCH: 29.3 pg (ref 26.0–34.0)
MCHC: 32.3 g/dL (ref 30.0–36.0)
MCV: 90.7 fL (ref 80.0–100.0)
Platelets: 112 10*3/uL — ABNORMAL LOW (ref 150–400)
RBC: 3.45 MIL/uL — ABNORMAL LOW (ref 4.22–5.81)
RDW: 13.2 % (ref 11.5–15.5)
WBC: 6.8 10*3/uL (ref 4.0–10.5)
nRBC: 0 % (ref 0.0–0.2)

## 2020-01-20 LAB — BASIC METABOLIC PANEL
Anion gap: 8 (ref 5–15)
BUN: 23 mg/dL (ref 8–23)
CO2: 23 mmol/L (ref 22–32)
Calcium: 8.3 mg/dL — ABNORMAL LOW (ref 8.9–10.3)
Chloride: 105 mmol/L (ref 98–111)
Creatinine, Ser: 1.12 mg/dL (ref 0.61–1.24)
GFR calc Af Amer: >60 mL/min (ref >60)
GFR calc non Af Amer: >60 mL/min (ref >60)
Glucose, Bld: 85 mg/dL (ref 70–99)
Potassium: 4.5 mmol/L (ref 3.5–5.1)
Sodium: 136 mmol/L (ref 135–145)

## 2020-01-20 LAB — SURGICAL PCR SCREEN
MRSA, PCR: NEGATIVE
Staphylococcus aureus: POSITIVE — AB

## 2020-01-20 LAB — GLUCOSE, BODY FLUID OTHER: Glucose, Body Fluid Other: <2 mg/dL

## 2020-01-20 LAB — PROTEIN, BODY FLUID (OTHER): Total Protein, Body Fluid Other: 4.8 g/dL

## 2020-01-20 LAB — GLUCOSE, CAPILLARY
Glucose-Capillary: 95 mg/dL (ref 70–99)
Glucose-Capillary: 237 mg/dL — ABNORMAL HIGH (ref 70–99)

## 2020-01-20 SURGERY — CREATION, PERICARDIAL WINDOW, ROBOT-ASSISTED
Anesthesia: General | Site: Chest | Laterality: Right

## 2020-01-20 MED ORDER — BUPIVACAINE LIPOSOME 1.3 % IJ SUSP
20.0000 mL | Freq: Once | INTRAMUSCULAR | Status: DC
  Filled 2020-01-20: qty 20

## 2020-01-20 MED ORDER — ACETAMINOPHEN 160 MG/5ML PO SOLN
1000.0000 mg | Freq: Four times a day (QID) | ORAL | Status: DC
  Administered 2020-01-20 – 2020-01-21 (×2): 1000 mg via ORAL
  Filled 2020-01-20 (×3): qty 40.6

## 2020-01-20 MED ORDER — ROCURONIUM BROMIDE 10 MG/ML (PF) SYRINGE
PREFILLED_SYRINGE | INTRAVENOUS | Status: DC | PRN
  Administered 2020-01-20: 7 mg via INTRAVENOUS

## 2020-01-20 MED ORDER — KETOROLAC TROMETHAMINE 30 MG/ML IJ SOLN
15.0000 mg | Freq: Four times a day (QID) | INTRAMUSCULAR | Status: DC
  Administered 2020-01-20 – 2020-01-25 (×18): 15 mg via INTRAVENOUS
  Filled 2020-01-20 (×18): qty 1

## 2020-01-20 MED ORDER — LIDOCAINE 2% (20 MG/ML) 5 ML SYRINGE
INTRAMUSCULAR | Status: DC | PRN
  Administered 2020-01-20: 100 mg via INTRAVENOUS

## 2020-01-20 MED ORDER — DEXAMETHASONE SODIUM PHOSPHATE 10 MG/ML IJ SOLN
INTRAMUSCULAR | Status: AC
  Filled 2020-01-20: qty 1

## 2020-01-20 MED ORDER — SENNOSIDES-DOCUSATE SODIUM 8.6-50 MG PO TABS
1.0000 | ORAL_TABLET | Freq: Every day | ORAL | Status: DC
  Administered 2020-01-20: 1 via ORAL
  Filled 2020-01-20 (×2): qty 1

## 2020-01-20 MED ORDER — VANCOMYCIN HCL IN DEXTROSE 1-5 GM/200ML-% IV SOLN
1000.0000 mg | Freq: Two times a day (BID) | INTRAVENOUS | Status: AC
  Administered 2020-01-20: 1000 mg via INTRAVENOUS
  Filled 2020-01-20: qty 200

## 2020-01-20 MED ORDER — CHLORHEXIDINE GLUCONATE 0.12 % MT SOLN: 15.0000 mL | Freq: Once | OROMUCOSAL | Status: AC

## 2020-01-20 MED ORDER — DM-GUAIFENESIN ER 30-600 MG PO TB12
1.0000 | ORAL_TABLET | Freq: Two times a day (BID) | ORAL | Status: DC
  Administered 2020-01-21 – 2020-01-24 (×3): 1 via ORAL
  Filled 2020-01-20 (×8): qty 1

## 2020-01-20 MED ORDER — MIDAZOLAM HCL 2 MG/2ML IJ SOLN
INTRAMUSCULAR | Status: AC
  Filled 2020-01-20: qty 2

## 2020-01-20 MED ORDER — FENTANYL CITRATE (PF) 100 MCG/2ML IJ SOLN
INTRAMUSCULAR | Status: DC | PRN
  Administered 2020-01-20: 100 ug via INTRAVENOUS

## 2020-01-20 MED ORDER — MUPIROCIN 2 % EX OINT
1.0000 "application " | TOPICAL_OINTMENT | Freq: Two times a day (BID) | CUTANEOUS | Status: AC
  Administered 2020-01-20 – 2020-01-25 (×10): 1 via NASAL
  Filled 2020-01-20 (×4): qty 22

## 2020-01-20 MED ORDER — ORAL CARE MOUTH RINSE: 15.0000 mL | Freq: Once | OROMUCOSAL | Status: AC

## 2020-01-20 MED ORDER — PROPOFOL 10 MG/ML IV BOLUS
INTRAVENOUS | Status: AC
  Filled 2020-01-20: qty 20

## 2020-01-20 MED ORDER — FENTANYL CITRATE (PF) 100 MCG/2ML IJ SOLN
25.0000 ug | INTRAMUSCULAR | Status: DC | PRN
  Administered 2020-01-20 – 2020-01-24 (×5): 25 ug via INTRAVENOUS
  Filled 2020-01-20 (×5): qty 2

## 2020-01-20 MED ORDER — FENTANYL CITRATE (PF) 250 MCG/5ML IJ SOLN
INTRAMUSCULAR | Status: AC
  Filled 2020-01-20: qty 5

## 2020-01-20 MED ORDER — BUPIVACAINE HCL (PF) 0.5 % IJ SOLN
INTRAMUSCULAR | Status: AC
  Filled 2020-01-20: qty 30

## 2020-01-20 MED ORDER — SODIUM CHLORIDE (PF) 0.9 % IJ SOLN
INTRAMUSCULAR | Status: AC
  Filled 2020-01-20: qty 10

## 2020-01-20 MED ORDER — INSULIN ASPART 100 UNIT/ML ~~LOC~~ SOLN
0.0000 [IU] | Freq: Three times a day (TID) | SUBCUTANEOUS | Status: DC
  Administered 2020-01-20: 8 [IU] via SUBCUTANEOUS
  Administered 2020-01-21: 4 [IU] via SUBCUTANEOUS
  Administered 2020-01-21: 2 [IU] via SUBCUTANEOUS

## 2020-01-20 MED ORDER — BUPIVACAINE LIPOSOME 1.3 % IJ SUSP
INTRAMUSCULAR | Status: DC | PRN
  Administered 2020-01-20: 50 mL

## 2020-01-20 MED ORDER — PROPOFOL 10 MG/ML IV BOLUS
INTRAVENOUS | Status: DC | PRN
  Administered 2020-01-20: 100 mg via INTRAVENOUS

## 2020-01-20 MED ORDER — LACTATED RINGERS IV SOLN: INTRAVENOUS | Status: DC

## 2020-01-20 MED ORDER — ACETAMINOPHEN 500 MG PO TABS
1000.0000 mg | ORAL_TABLET | Freq: Once | ORAL | Status: AC
  Administered 2020-01-20: 1000 mg via ORAL
  Filled 2020-01-20: qty 2

## 2020-01-20 MED ORDER — PROPOFOL 10 MG/ML IV BOLUS
INTRAVENOUS | Status: AC
  Filled 2020-01-20: qty 40

## 2020-01-20 MED ORDER — CHLORHEXIDINE GLUCONATE CLOTH 2 % EX PADS
6.0000 | MEDICATED_PAD | Freq: Every day | CUTANEOUS | Status: DC
  Administered 2020-01-22 – 2020-01-24 (×2): 6 via TOPICAL

## 2020-01-20 MED ORDER — CHLORHEXIDINE GLUCONATE 0.12 % MT SOLN
OROMUCOSAL | Status: AC
  Administered 2020-01-20: 15 mL via OROMUCOSAL
  Filled 2020-01-20: qty 15

## 2020-01-20 MED ORDER — CHLORHEXIDINE GLUCONATE 4 % EX LIQD
CUTANEOUS | Status: AC
  Filled 2020-01-20: qty 15

## 2020-01-20 MED ORDER — FENTANYL CITRATE (PF) 100 MCG/2ML IJ SOLN: 25.0000 ug | INTRAMUSCULAR | Status: DC | PRN

## 2020-01-20 MED ORDER — SUGAMMADEX SODIUM 200 MG/2ML IV SOLN
INTRAVENOUS | Status: DC | PRN
  Administered 2020-01-20: 130 mg via INTRAVENOUS

## 2020-01-20 MED ORDER — ACETAMINOPHEN 500 MG PO TABS
1000.0000 mg | ORAL_TABLET | Freq: Four times a day (QID) | ORAL | Status: DC
  Administered 2020-01-20 – 2020-01-23 (×5): 1000 mg via ORAL
  Filled 2020-01-20 (×6): qty 2

## 2020-01-20 MED ORDER — TRAMADOL HCL 50 MG PO TABS
50.0000 mg | ORAL_TABLET | Freq: Four times a day (QID) | ORAL | Status: DC | PRN
  Administered 2020-01-20 – 2020-01-22 (×4): 50 mg via ORAL
  Filled 2020-01-20 (×4): qty 1

## 2020-01-20 MED ORDER — BISACODYL 5 MG PO TBEC
10.0000 mg | DELAYED_RELEASE_TABLET | Freq: Every day | ORAL | Status: DC
  Filled 2020-01-20 (×2): qty 2

## 2020-01-20 MED ORDER — DEXAMETHASONE SODIUM PHOSPHATE 10 MG/ML IJ SOLN
INTRAMUSCULAR | Status: DC | PRN
  Administered 2020-01-20: 10 mg via INTRAVENOUS

## 2020-01-20 MED ORDER — LACTATED RINGERS IV SOLN: INTRAVENOUS | Status: DC | PRN

## 2020-01-20 MED ORDER — VANCOMYCIN HCL 1000 MG IV SOLR
INTRAVENOUS | Status: DC | PRN
  Administered 2020-01-20: 1000 mg via INTRAVENOUS

## 2020-01-20 MED ORDER — ENSURE ENLIVE PO LIQD
237.0000 mL | Freq: Two times a day (BID) | ORAL | Status: DC
  Administered 2020-01-21 – 2020-01-22 (×3): 237 mL via ORAL

## 2020-01-20 MED ORDER — ONDANSETRON HCL 4 MG/2ML IJ SOLN: 4.0000 mg | Freq: Once | INTRAMUSCULAR | Status: DC | PRN

## 2020-01-20 MED ORDER — SODIUM CHLORIDE 0.9 % IV SOLN: INTRAVENOUS | Status: DC | PRN

## 2020-01-20 MED ORDER — VANCOMYCIN HCL IN DEXTROSE 1-5 GM/200ML-% IV SOLN
INTRAVENOUS | Status: AC
  Filled 2020-01-20: qty 200

## 2020-01-20 MED ORDER — ENOXAPARIN SODIUM 40 MG/0.4ML ~~LOC~~ SOLN
40.0000 mg | Freq: Every day | SUBCUTANEOUS | Status: DC
  Administered 2020-01-21 – 2020-01-23 (×3): 40 mg via SUBCUTANEOUS
  Filled 2020-01-20 (×4): qty 0.4

## 2020-01-20 MED ORDER — 0.9 % SODIUM CHLORIDE (POUR BTL) OPTIME
TOPICAL | Status: DC | PRN
  Administered 2020-01-20: 1000 mL

## 2020-01-20 MED ORDER — KETOROLAC TROMETHAMINE 30 MG/ML IJ SOLN
30.0000 mg | Freq: Four times a day (QID) | INTRAMUSCULAR | Status: DC
  Administered 2020-01-20: 30 mg via INTRAVENOUS
  Filled 2020-01-20: qty 1

## 2020-01-20 MED FILL — Heparin Sod (Porcine)-NaCl IV Soln 1000 Unit/500ML-0.9%: INTRAVENOUS | Qty: 500 | Status: AC

## 2020-01-20 SURGICAL SUPPLY — 53 items
BLADE STERNUM SYSTEM 6 (BLADE) IMPLANT
CONNECTOR BLAKE 2:1 CARIO BLK (MISCELLANEOUS) ×3 IMPLANT
DEFOGGER SCOPE WARMER CLEARIFY (MISCELLANEOUS) ×3 IMPLANT
DERMABOND ADHESIVE PROPEN (GAUZE/BANDAGES/DRESSINGS) ×2
DERMABOND ADVANCED (GAUZE/BANDAGES/DRESSINGS) ×2
DERMABOND ADVANCED .7 DNX12 (GAUZE/BANDAGES/DRESSINGS) ×1 IMPLANT
DERMABOND ADVANCED .7 DNX6 (GAUZE/BANDAGES/DRESSINGS) ×1 IMPLANT
DRAIN CHANNEL 19F RND (DRAIN) ×6 IMPLANT
DRAPE ARM DVNC X/XI (DISPOSABLE) ×4 IMPLANT
DRAPE COLUMN DVNC XI (DISPOSABLE) ×1 IMPLANT
DRAPE CV SPLIT W-CLR ANES SCRN (DRAPES) ×3 IMPLANT
DRAPE DA VINCI XI ARM (DISPOSABLE) ×8
DRAPE DA VINCI XI COLUMN (DISPOSABLE) ×2
DRAPE SLUSH/WARMER DISC (DRAPES) IMPLANT
DRSG AQUACEL AG ADV 3.5X14 (GAUZE/BANDAGES/DRESSINGS) IMPLANT
ELECT REM PT RETURN 9FT ADLT (ELECTROSURGICAL)
ELECTRODE REM PT RTRN 9FT ADLT (ELECTROSURGICAL) IMPLANT
EVACUATOR SILICONE 100CC (DRAIN) ×3 IMPLANT
FELT TEFLON 1X6 (MISCELLANEOUS) IMPLANT
GAUZE SPONGE 4X4 12PLY STRL (GAUZE/BANDAGES/DRESSINGS) IMPLANT
GAUZE SPONGE 4X4 12PLY STRL LF (GAUZE/BANDAGES/DRESSINGS) ×3 IMPLANT
GLOVE BIO SURGEON STRL SZ7 (GLOVE) ×6 IMPLANT
GOWN STRL REUS W/ TWL LRG LVL3 (GOWN DISPOSABLE) ×1 IMPLANT
GOWN STRL REUS W/ TWL XL LVL3 (GOWN DISPOSABLE) ×1 IMPLANT
GOWN STRL REUS W/TWL LRG LVL3 (GOWN DISPOSABLE) ×2
GOWN STRL REUS W/TWL XL LVL3 (GOWN DISPOSABLE) ×2
HEMOSTAT POWDER SURGIFOAM 1G (HEMOSTASIS) IMPLANT
IRRIGATION STRYKERFLOW (MISCELLANEOUS) ×1 IMPLANT
IRRIGATOR STRYKERFLOW (MISCELLANEOUS) ×3
KIT SUCTION CATH 14FR (SUCTIONS) IMPLANT
NEEDLE HYPO 25GX1X1/2 BEV (NEEDLE) IMPLANT
PACK CHEST (CUSTOM PROCEDURE TRAY) IMPLANT
PAD ARMBOARD 7.5X6 YLW CONV (MISCELLANEOUS) ×6 IMPLANT
PAD ELECT DEFIB RADIOL ZOLL (MISCELLANEOUS) IMPLANT
SEAL CANN UNIV 5-8 DVNC XI (MISCELLANEOUS) ×3 IMPLANT
SEAL XI 5MM-8MM UNIVERSAL (MISCELLANEOUS) ×6
SET TUBE SMOKE EVAC HIGH FLOW (TUBING) ×3 IMPLANT
STOPCOCK 4 WAY LG BORE MALE ST (IV SETS) IMPLANT
SUT BONE WAX W31G (SUTURE) IMPLANT
SUT MNCRL AB 3-0 PS2 18 (SUTURE) IMPLANT
SUT PDS AB 1 CTX 36 (SUTURE) IMPLANT
SUT SILK 2 0 SH CR/8 (SUTURE) IMPLANT
SUT STEEL 6MS V (SUTURE) IMPLANT
SUT STEEL SZ 6 DBL 3X14 BALL (SUTURE) IMPLANT
SUT VIC AB 2-0 CTX 36 (SUTURE) IMPLANT
SUT VICRYL 0 UR6 27IN ABS (SUTURE) ×6 IMPLANT
SYR 50ML LL SCALE MARK (SYRINGE) IMPLANT
SYSTEM SAHARA CHEST DRAIN ATS (WOUND CARE) IMPLANT
TAPE CLOTH SURG 4X10 WHT LF (GAUZE/BANDAGES/DRESSINGS) ×3 IMPLANT
TOWEL GREEN STERILE (TOWEL DISPOSABLE) IMPLANT
TRAP SPECIMEN MUCUS 40CC (MISCELLANEOUS) ×6 IMPLANT
TRAY FOLEY SLVR 16FR TEMP STAT (SET/KITS/TRAYS/PACK) IMPLANT
TUBING EXTENTION W/L.L. (IV SETS) IMPLANT

## 2020-01-20 NOTE — Plan of Care (Signed)

## 2020-01-20 NOTE — Progress Notes (Signed)
TCTS evening Rounds  DOS s/p robotic pericardial window Extubated Minor complaints of pain PE: BP 105/90    Pulse 62    Temp 98.2 F (36.8 C)    Resp 14    Ht 5\' 8"  (1.727 m)    Wt 65 kg    SpO2 98%    BMI 21.79 kg/m  RRR CTA   Intake/Output Summary (Last 24 hours) at 01/20/2020 1856 Last data filed at 01/20/2020 1700 Gross per 24 hour  Intake 700.4 ml  Output 1138 ml  Net -437.6 ml      A/p: continue pain control and early post operative management  Melysa Schroyer Z. Orvan Seen, [place]

## 2020-01-20 NOTE — Anesthesia Procedure Notes (Signed)
Arterial Line Insertion Start/End8/16/2021 1:00 PM Performed by: Janace Litten, CRNA, CRNA  Patient location: Pre-op. Preanesthetic checklist: patient identified, risks and benefits discussed and pre-op evaluation Lidocaine 1% used for infiltration Right, radial was placed Catheter size: 20 G Maximum sterile barriers used   Attempts: 1 Procedure performed without using ultrasound guided technique. Following insertion, dressing applied and Biopatch. Post procedure assessment: normal  Patient tolerated the procedure well with no immediate complications.

## 2020-01-20 NOTE — Op Note (Signed)
      [address]       [place] [address]             [phone]        01/20/2020  Patient:  Glen Scott Pre-Op Dx: Pericardial effusion Post-op Dx: Same Procedure: - Robotic assisted right video thoracoscopy - Pericardial window - Intercostal nerve block  Surgeon and Role:      * Rosendo Couser, Lucile Crater, MD - Primary    *T. Harriet Pho, PA-C- assisting  Anesthesia  general EBL: Minimal  Blood Administration: None Specimen: Pleural effusion, pericardium  Drains: 19 F chest tube in right chest.  19 French Blake in the pericardium connected to a bulb Counts: correct   Indications: 74 year old male that presents with a large pericardial effusion in the setting of a likely malignant primary lung cancer given history of for malignant effusion.  He is undergone a successful pericardial drain placement by cardiology.  He will require definitive pericardial window which will be performed on 01/20/2020 pending correction of his kidney and renal function.  Findings: Thickened pericardium.  Minimal pericardial fluid fibrinous exudate with epicardial inflammation.  Operative Technique: After the risks, benefits and alternatives were thoroughly discussed, the patient was brought to the operative theatre.  Anesthesia was induced, and the patient was then placed in a left lazy lateral decubitus position and was prepped and draped in normal sterile fashion.  An appropriate surgical pause was performed, and pre-operative antibiotics were dosed accordingly.  We began by placing our 3 robotic ports in the the intercostal spaces targeting the pericardium.  A 56mm assistant port was placed in the 9th intercostal space in the posterior axillary line.  The robot was then docked and all instruments were passed under direct visualization.    The pericardium was visualized, and a 5 x 5 centimeters pericardial window was created using Bovie cautery.  The pericardium was thickened and inflamed.   There is also fibrinous exudate on the visceral surface..  Immediate release of scant bloody pericardial fluid was released.  The fluid and pericardium were sent for specimen.  A 19 [place] Blake drain was passed through right inferior robotic port into the pericardium.  An intercostal nerve block was performed under direct visualization.  The skin and soft tissue were closed with absorbable suture    The patient tolerated the procedure without any immediate complications, and was transferred to the PACU in stable condition.  Tayler Lassen Bary Leriche

## 2020-01-20 NOTE — Anesthesia Procedure Notes (Signed)
Procedure Name: Intubation Date/Time: 01/20/2020 1:36 PM Performed by: Barrington Ellison, CRNA Pre-anesthesia Checklist: Patient identified, Emergency Drugs available and Suction available Patient Re-evaluated:Patient Re-evaluated prior to induction Oxygen Delivery Method: Circle System Utilized Preoxygenation: Pre-oxygenation with 100% oxygen Induction Type: IV induction Ventilation: Two handed mask ventilation required Laryngoscope Size: Miller and 2 Grade View: Grade I Tube type: Oral Endobronchial tube: Double lumen EBT, EBT position confirmed by fiberoptic bronchoscope, EBT position confirmed by auscultation and Left Number of attempts: 1 Airway Equipment and Method: Stylet and Oral airway Placement Confirmation: ETT inserted through vocal cords under direct vision,  positive ETCO2 and breath sounds checked- equal and bilateral Secured at: 30 cm Tube secured with: Tape Dental Injury: Teeth and Oropharynx as per pre-operative assessment  Comments: Placed by Loleta Books SRNA

## 2020-01-20 NOTE — Progress Notes (Signed)
     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 Likely will be able to go the floor post-op.  Janiya Millirons Bary Leriche

## 2020-01-20 NOTE — Progress Notes (Addendum)
Progress Note  Patient Name: Glen Scott Date of Encounter: 01/20/2020  Primary Cardiologist: Fransico Him, MD  Subjective   Reports he's feeling overall OK. Less SOB. Some pain at drain placement site. For pericardial window today.  Inpatient Medications    Scheduled Meds:  Chlorhexidine Gluconate Cloth  6 each Topical Daily   colchicine  0.6 mg Oral BID   dextromethorphan-guaiFENesin  1 tablet Oral BID   mouth rinse  15 mL Mouth Rinse BID   sodium chloride flush  3 mL Intravenous Q12H   Continuous Infusions:  sodium chloride     PRN Meds: sodium chloride, docusate sodium, fentaNYL (SUBLIMAZE) injection, ondansetron (ZOFRAN) IV, polyethylene glycol, sodium chloride flush   Vital Signs    Vitals:   01/20/20 0600 01/20/20 0700 01/20/20 0800 01/20/20 0809  BP: 119/60 (!) 117/56 117/61   Pulse: 72 (!) 56 63   Resp: 14 12 13    Temp:    97.7 F (36.5 C)  TempSrc:    Oral  SpO2: 98% 100% 100%   Weight: 65 kg     Height:        Intake/Output Summary (Last 24 hours) at 01/20/2020 1041 Last data filed at 01/20/2020 0600 Gross per 24 hour  Intake 0 ml  Output 850 ml  Net -850 ml   Last 3 Weights 01/20/2020 01/19/2020 01/18/2020  Weight (lbs) 143 lb 4.8 oz 149 lb 4 oz 145 lb  Weight (kg) 65 kg 67.7 kg 65.772 kg     Telemetry    NSR - Personally Reviewed  Physical Exam   GEN: No acute distress, thin. HEENT: Normocephalic, atraumatic, sclera non-icteric. Neck: No JVD or bruits. Cardiac: RRR no murmurs, rubs, or gallops.  Radials/DP/PT 1+ and equal bilaterally.  Respiratory: Slightly decreased BS at bases bilaterally. Breathing is unlabored. GI: Soft, nontender, non-distended, BS +x 4. MS: no deformity. Extremities: No clubbing or cyanosis. No edema. Distal pedal pulses are 2+ and equal bilaterally. Neuro:  AAOx3. Follows commands. Psych:  Responds to questions appropriately with a normal affect.  Labs    High Sensitivity Troponin:   Recent Labs  Lab  01/18/20 2250 01/19/20 0309  TROPONINIHS 12 15      Cardiac EnzymesNo results for input(s): TROPONINI in the last 168 hours. No results for input(s): TROPIPOC in the last 168 hours.   Chemistry Recent Labs  Lab 01/18/20 2250 01/20/20 0652  NA 133* 136  K 4.5 4.5  CL 101 105  CO2 17* 23  GLUCOSE 130* 85  BUN 28* 23  CREATININE 1.64* 1.12  CALCIUM 8.4* 8.3*  PROT 5.5*  --   ALBUMIN 2.8*  --   AST 115*  --   ALT 115*  --   ALKPHOS 73  --   BILITOT 1.0  --   GFRNONAA 41* >60  GFRAA 48* >60  ANIONGAP 15 8     Hematology Recent Labs  Lab 01/18/20 2250 01/20/20 0652  WBC 16.9* 6.8  RBC 3.82* 3.45*  HGB 11.1* 10.1*  HCT 37.1* 31.3*  MCV 97.1 90.7  MCH 29.1 29.3  MCHC 29.9* 32.3  RDW 13.4 13.2  PLT 251 112*    BNPNo results for input(s): BNP, PROBNP in the last 168 hours.   DDimer No results for input(s): DDIMER in the last 168 hours.   Radiology    CARDIAC CATHETERIZATION  Result Date: 01/19/2020 Large pericardial effusion with 1080 mL bloody fluid drained via subxiphoid approach.Large pericardial effusion with 1080 mL bloody fluid drained via  subxiphoid approach. [name], MD   DG Chest Port 1 View  Result Date: 01/18/2020 CLINICAL DATA:  Chest pain and shortness of breath. EXAM: PORTABLE CHEST 1 VIEW COMPARISON:  None. FINDINGS: Mild atelectasis and/or infiltrate is seen within the retrocardiac region of the left lung base. There is a small left pleural effusion. No pneumothorax is identified. The cardiac silhouette is markedly enlarged. There is mild calcification of the aortic arch. The visualized skeletal structures are unremarkable. IMPRESSION: 1. Mild left basilar atelectasis and/or infiltrate. 2. Small left pleural effusion. Electronically Signed   By: Virgina Norfolk M.D.   On: 01/18/2020 22:24   ECHOCARDIOGRAM COMPLETE  Result Date: 01/18/2020    ECHOCARDIOGRAM REPORT   Patient Name:   Glen Scott Date of Exam: 01/18/2020 Medical Rec #:   [id number]  Height:       68.0 in Accession #:    [id number] Weight:       145.0 lb Date of Birth:  1946-01-03  BSA:          1.783 m Patient Age:    59 years   BP:           103/80 mmHg Patient Gender: M          HR:           107 bpm. Exam Location:  Inpatient Procedure: 2D Echo, 3D Echo, Color Doppler and Cardiac Doppler STAT ECHO Indications:    I31.3 Pericardial effusion (noninflammatory)  History:        Patient has no prior history of Echocardiogram examinations.                 Risk Factors:Current Smoker.  Sonographer:    Raquel Sarna Senior RDCS Referring Phys: [place]  Sonographer Comments: Respirometer not tracking well, labels made by sonographer observation. IMPRESSIONS  1. Left ventricular ejection fraction, by estimation, is 65 to 70%. The left ventricle has normal function. The left ventricle has no regional wall motion abnormalities. Left ventricular diastolic function could not be evaluated.  2. Right ventricular systolic function is normal. The right ventricular size is normal. There is normal pulmonary artery systolic pressure. The estimated right ventricular systolic pressure is 61.9 mmHg.  3. The mitral valve is normal in structure. No evidence of mitral valve regurgitation. No evidence of mitral stenosis.  4. The aortic valve is normal in structure. Aortic valve regurgitation is not visualized. No aortic stenosis is present.  5. The inferior vena cava is dilated in size with <50% respiratory variability, suggesting right atrial pressure of 15 mmHg.  6. Larger circumferential pericardial effusion measuring 2.79 x 3.33cm at greatest diameter. There is RA inversion. Possible subtle RV diastolic collapse. The RV cavity is small. Borderline respirophasic changes in MV inflow velocities with a 108mmHg difference. The IVC is dilated with < 50% respirophasic change. Findings worrisome for cardiac tamponade. . Large pericardial effusion. The pericardial effusion is circumferential. Moderate  pleural effusion in the left lateral region. FINDINGS  Left Ventricle: Left ventricular ejection fraction, by estimation, is 65 to 70%. The left ventricle has normal function. The left ventricle has no regional wall motion abnormalities. The left ventricular internal cavity size was normal in size. There is  no left ventricular hypertrophy. Left ventricular diastolic function could not be evaluated. Right Ventricle: The right ventricular size is normal. No increase in right ventricular wall thickness. Right ventricular systolic function is normal. There is normal pulmonary artery systolic pressure. The tricuspid regurgitant velocity is 2.05 m/s, and  with an assumed right atrial pressure of 15 mmHg, the estimated right ventricular systolic pressure is 31.5 mmHg. Left Atrium: Left atrial size was normal in size. Right Atrium: Right atrial size was normal in size. Pericardium: Larger circumferential pericardial effusion measuring 2.79 x 3.33cm at greatest diameter. There is RA inversion. Possible subtle RV diastolic collapse. The RV cavity is small. Borderline respirophasic changes in MV inflow velocities with a 47mmHg difference. The IVC is dilated with < 50% respirophasic change. Findings worrisome for cardiac tamponade. A large pericardial effusion is present. The pericardial effusion is circumferential. Mitral Valve: The mitral valve is normal in structure. Normal mobility of the mitral valve leaflets. No evidence of mitral valve regurgitation. No evidence of mitral valve stenosis. Tricuspid Valve: The tricuspid valve is normal in structure. Tricuspid valve regurgitation is trivial. No evidence of tricuspid stenosis. Aortic Valve: The aortic valve is normal in structure. Aortic valve regurgitation is not visualized. No aortic stenosis is present. Pulmonic Valve: The pulmonic valve was normal in structure. Pulmonic valve regurgitation is not visualized. No evidence of pulmonic stenosis. Aorta: The aortic root is  normal in size and structure. Venous: The inferior vena cava is dilated in size with less than 50% respiratory variability, suggesting right atrial pressure of 15 mmHg. IAS/Shunts: No atrial level shunt detected by color flow Doppler. Additional Comments: There is a moderate pleural effusion in the left lateral region.  RIGHT VENTRICLE RV S prime:     12.10 cm/s TAPSE (M-mode): 1.8 cm TRICUSPID VALVE TR Peak grad:   16.8 mmHg TR Vmax:        205.00 cm/s Fransico Him MD Electronically signed by Fransico Him MD Signature Date/Time: 01/18/2020/11:07:22 PM    Final    ECHOCARDIOGRAM LIMITED  Result Date: 01/19/2020    ECHOCARDIOGRAM LIMITED REPORT   Patient Name:   Glen Scott Date of Exam: 01/19/2020 Medical Rec #:  [id number]  Height:       68.0 in Accession #:    [id number] Weight:       149.3 lb Date of Birth:  10/24/1945  BSA:          1.805 m Patient Age:    31 years   BP:           148/79 mmHg Patient Gender: M          HR:           81 bpm. Exam Location:  Inpatient Procedure: Limited Echo Indications:    Pericardial effusion 423.9 / I31.3  History:        Patient has prior history of Echocardiogram examinations, most                 recent 01/18/2020. AAA. Pericardial effusion.  Sonographer:    Vickie Epley RDCS Referring Phys: [place]  1. Left ventricular ejection fraction, by estimation, is 65 to 70%. The left ventricle has normal function. The left ventricle has no regional wall motion abnormalities.  2. Right ventricular systolic function is normal. The right ventricular size is normal.  3. Pericardial effusion s/p drainage, now very small circumferential fluid remaining. Moderate pleural effusion in both left and right lateral regions.  4. The inferior vena cava is normal in size with greater than 50% respiratory variability, suggesting right atrial pressure of 3 mmHg. Conclusion(s)/Recommendation(s): S/P pericardiocentesis, with only very small circumferential effusion remaining. No  evidence of tamponade, IVC normal and collapses, no apparent RA/RV collapse. FINDINGS  Left Ventricle: Left ventricular  ejection fraction, by estimation, is 65 to 70%. The left ventricle has normal function. The left ventricle has no regional wall motion abnormalities. The left ventricular internal cavity size was normal in size. Right Ventricle: The right ventricular size is normal. No increase in right ventricular wall thickness. Right ventricular systolic function is normal. Pericardium: Pericardial effusion s/p drainage, now very small circumferential fluid remaining. A small pericardial effusion is present. Venous: The inferior vena cava is normal in size with greater than 50% respiratory variability, suggesting right atrial pressure of 3 mmHg. Additional Comments: There is a moderate pleural effusion in both left and right lateral regions. Buford Dresser MD Electronically signed by Buford Dresser MD Signature Date/Time: 01/19/2020/3:37:49 PM    Final     Cardiac Studies   2D echo 01/18/20 1. Left ventricular ejection fraction, by estimation, is 65 to 70%. The  left ventricle has normal function. The left ventricle has no regional  wall motion abnormalities. Left ventricular diastolic function could not  be evaluated.  2. Right ventricular systolic function is normal. The right ventricular  size is normal. There is normal pulmonary artery systolic pressure. The  estimated right ventricular systolic pressure is 16.1 mmHg.  3. The mitral valve is normal in structure. No evidence of mitral valve  regurgitation. No evidence of mitral stenosis.  4. The aortic valve is normal in structure. Aortic valve regurgitation is  not visualized. No aortic stenosis is present.  5. The inferior vena cava is dilated in size with <50% respiratory  variability, suggesting right atrial pressure of 15 mmHg.  6. Larger circumferential pericardial effusion measuring 2.79 x 3.33cm at  greatest  diameter. There is RA inversion. Possible subtle RV diastolic  collapse. The RV cavity is small. Borderline respirophasic changes in MV  inflow velocities with a 36mmHg  difference. The IVC is dilated with < 50% respirophasic change. Findings  worrisome for cardiac tamponade. . Large pericardial effusion. The  pericardial effusion is circumferential. Moderate pleural effusion in the  left lateral region.   Patient Profile     74 y.o. male with history of  AAA, Crohn's disease longstanding tobacco smoker with recently diagnosed pulmonary embolus (presumably on August 6, placed on Xarelto) as well as likely lung cancer (malignant cells seen on recent thoracentesis at [facility]). He presented to the hospital with upper abdominal pain to his back, dyspnea, nausea and vomiting. He went to the [facility] where imaging reviewed at enlarging pericardial effusion with likely tamponade physiology as well as increased thrombus and AAA.  He was was transferred from [facility] to [facility] ER to ER transfer due to large pericardial effusion noted on CT scan. 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  There was concern for possible leaking aneurysm or inflammatory aneurysm/mycotic aneurysm cannot rule out. Vascular surgery was contacted with no plans for immediate intervention of the AAA thrombus. He was seen by the cardiology team and underwent pericardiocentesis with -1080 bloody fluid drained. Plan for pericardial window 01/20/20.  Assessment & Plan    1. 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 Recently diagnosed PE and lung  cancer - anticoag on hold given pericardial effusion, need to  resume when possible - patient was to meet with oncology as outpatient, question whether inpt consultation is needed given progressive events  3. AAA with thrombus - per notes, vascular team was contacted this admission with no plans for immediate intervention but will need follow-up  4. Multiple lab abnormalities including AKI, abnormal LFTs, hyponatremia, anemia and thrombocytopenia - managed in context of the above - creatinine improved today - need to continue to follow Hgb/platelets  For questions or updates, please contact [place] Please consult [email or web address] for contact info under Cardiology/STEMI.  Signed, Charlie Pitter, PA-C 01/20/2020, 10:41 AM    Patient seen, examined. Available data reviewed. Agree with findings, assessment, and plan as outlined by Melina Copa, PA-C.  On my exam the patient is alert, oriented, in no distress.  JVP is normal, lungs are diminished in the bases but otherwise clear, heart sounds are distant and regular with no murmur gallop, pericardial drain is in place with no surrounding erythema or drainage, extremities demonstrate mild pedal edema bilaterally.  Plans noted for more definitive treatment with pericardial window today by Dr. Kipp Brood.  Rivaroxaban is appropriately on hold for surgery.  We will follow postoperatively.  Sherren Mocha, M.D. 01/20/2020 11:33 AM

## 2020-01-20 NOTE — Anesthesia Postprocedure Evaluation (Signed)
Anesthesia Post Note  Patient: Glen Scott  Procedure(s) Performed: XI ROBOTIC ASSISTED THORACOSCOPY PERICARDIAL WINDOW, double lumen ET tube (Right Chest)     Patient location during evaluation: PACU Anesthesia Type: General Level of consciousness: awake and alert, oriented and awake Pain management: pain level controlled Vital Signs Assessment: post-procedure vital signs reviewed and stable Respiratory status: spontaneous breathing, nonlabored ventilation, respiratory function stable and patient connected to nasal cannula oxygen Cardiovascular status: blood pressure returned to baseline and stable Postop Assessment: no apparent nausea or vomiting Anesthetic complications: no   No complications documented.  Last Vitals:  Vitals:   01/20/20 1525 01/20/20 1600  BP: 129/65 127/68  Pulse: 75   Resp: 19   Temp: 36.8 C   SpO2: 98%     Last Pain:  Vitals:   01/20/20 1600  TempSrc:   PainSc: 10-Worst pain ever                 Catalina Gravel

## 2020-01-20 NOTE — Progress Notes (Signed)
   NAME:  Mel Langan, MRN:  [id number], DOB:  01-09-1946, LOS: 2 ADMISSION DATE:  01/18/2020, CONSULTATION DATE:  01/20/20 REFERRING MD:  EDP, CHIEF COMPLAINT:  Dyspnea and abdominal pain   Brief History   74 y.o. M with PMH of recently diagnosed malignant cells on thoracentesis at the [facility] on 01/08/20, Colitis, bilateral PE on xarelto, AAA who developed dyspnea, abdominal pain, and n/v this morning, so presented to [facility] where CT abd/pelvis showed worsening thrombus in known AAA with paricardial effusion and possible tamponade.  Accepted in transfer to the ED where PCCM was asked to admit  Past Medical History   has a past medical history of AAA (abdominal aortic aneurysm) ([place]), Bilateral pleural effusion, Cigarette smoker, Crohn's disease ([place]), History of pulmonary embolus (PE), Pericardial effusion, and Umbilical hernia.   Significant Hospital Events   8/14 Transfer from [place] to [facility], [place] admit 8/15 Pericardial drain placement  8/16: going to OR later today for pericardial window  Consults:  Cardiology CTS  Procedures:    Significant Diagnostic Tests:  8/14 CXR>>Mild left basilar atelectasis and/or infiltrate.  Small left pleural effusion.  Micro Data:  8/14 Sars-CoV-2>>neg  Antimicrobials:     Interim history/subjective:  No distress. C/o pain w/ deep breath   Objective   Blood pressure 117/61, pulse 63, temperature 97.7 F (36.5 C), temperature source Oral, resp. rate 13, height 5\' 8"  (1.727 m), weight 65 kg, SpO2 100 %.        Intake/Output Summary (Last 24 hours) at 01/20/2020 1059 Last data filed at 01/20/2020 0600 Gross per 24 hour  Intake 0 ml  Output 850 ml  Net -850 ml   Filed Weights   01/18/20 2000 01/19/20 0550 01/20/20 0600  Weight: 65.8 kg 67.7 kg 65 kg   Physical Exam: General this is a pleasant 74 year old white male. Resting in bed.  HENT NCAT. No JVD. MMM Pulm decreased bases.  Card RRR; pericardial drain intact abd not  tender + bowel sounds Neuro intact Ext warm and dry    Resolved Hospital Problem list     Assessment & Plan:   Malignant pericardial effusion with tamponade s/p pericardial drain 8/14 Patient currently being worked up at the [place] in [place] for probable lung cancer (+malignant pleural effusion with spiculated lung mass). Imaging not available for review. Plan Getting window today  Will need out-pt follow up at [place] bilateral PE Plan Holding xarelto pending OR    AAA with increasing thrombus PCCM attending spoke with vascular on call on admission -no emergent intervention needed overnight Plan Resume systemic AC when ok w/ surg   Best practice:  Diet: NPO at midnight Pain/Anxiety/Delirium protocol (if indicated): prn fentanyl VAP protocol (if indicated): n/a DVT prophylaxis: SCD's GI prophylaxis: n/a Glucose control: SSI Mobility: bed rest Code Status: full code Family Communication: Updated patient at bedside on 8/15 Disposition: ICU   Erick Colace ACNP-BC [place] Pager # [phone] OR # [phone] if no answer

## 2020-01-20 NOTE — Brief Op Note (Signed)
01/18/2020 - 01/20/2020  3:02 PM  PATIENT:  Glen Scott  74 y.o. male  PRE-OPERATIVE DIAGNOSIS:  pericardial window  POST-OPERATIVE DIAGNOSIS:  pericardial window  PROCEDURE:  Procedure(s): XI ROBOTIC ASSISTED THORACOSCOPY PERICARDIAL WINDOW, double lumen ET tube (Right)  SURGEON:  Surgeon(s) and Role:    * Lightfoot, Lucile Crater, MD - Primary  PHYSICIAN ASSISTANT:   Nicholes Rough, PA-C  ANESTHESIA:   general  EBL:  100 mL   BLOOD ADMINISTERED:none  DRAINS: ONE BLAKE PERICARDIAL DRAIN AND ONE BLAKE CHEST DRAIN   LOCAL MEDICATIONS USED:  BUPIVICAINE   SPECIMEN:  Source of Specimen:  PORTION OF THE PERICARDIUM  DISPOSITION OF SPECIMEN:  PATHOLOGY  COUNTS:  YES  DICTATION: .Dragon Dictation  PLAN OF CARE: Admit to inpatient   PATIENT DISPOSITION:  PACU - hemodynamically stable.   Delay start of Pharmacological VTE agent (>24hrs) due to surgical blood loss or risk of bleeding: yes

## 2020-01-20 NOTE — Transfer of Care (Signed)
Immediate Anesthesia Transfer of Care Note  Patient: Atharv Krahn  Procedure(s) Performed: XI ROBOTIC ASSISTED THORACOSCOPY PERICARDIAL WINDOW, double lumen ET tube (Right Chest)  Patient Location: PACU  Anesthesia Type:General  Level of Consciousness: awake  Airway & Oxygen Therapy: Patient Spontanous Breathing and Patient connected to nasal cannula oxygen  Post-op Assessment: Report given to RN  Post vital signs: Reviewed and stable  Last Vitals:  Vitals Value Taken Time  BP 133/68 01/20/20 1519  Temp    Pulse 73 01/20/20 1520  Resp 22 01/20/20 1520  SpO2 98 % 01/20/20 1520  Vitals shown include unvalidated device data.  Last Pain:  Vitals:   01/20/20 1140  TempSrc:   PainSc: 5       Patients Stated Pain Goal: 5 (29/09/03 0149)  Complications: No complications documented.

## 2020-01-20 NOTE — Anesthesia Preprocedure Evaluation (Addendum)
Anesthesia Evaluation  Patient identified by MRN, date of birth, ID band Patient awake    Reviewed: Allergy & Precautions, NPO status , Patient's Chart, lab work & pertinent test results  Airway Mallampati: II  TM Distance: >3 FB Neck ROM: Full    Dental  (+) Dental Advisory Given, Edentulous Upper, Poor Dentition, Chipped, Missing   Pulmonary former smoker, PE   Pulmonary exam normal breath sounds clear to auscultation       Cardiovascular + Peripheral Vascular Disease  Normal cardiovascular exam Rhythm:Regular Rate:Normal  malignant pleural effusion, now with pericardial effusion, and tamponade, s/p pericardial drain placement   Neuro/Psych negative neurological ROS  negative psych ROS   GI/Hepatic negative GI ROS, Neg liver ROS, Crohn's disease    Endo/Other  negative endocrine ROS  Renal/GU negative Renal ROS     Musculoskeletal negative musculoskeletal ROS (+)   Abdominal   Peds  Hematology  (+) Blood dyscrasia (Xarelto; Thrombocytopenia), anemia ,   Anesthesia Other Findings Day of surgery medications reviewed with the patient.  Reproductive/Obstetrics                            Anesthesia Physical Anesthesia Plan  ASA: III  Anesthesia Plan: General   Post-op Pain Management:    Induction: Intravenous  PONV Risk Score and Plan: 2  Airway Management Planned: Double Lumen EBT  Additional Equipment: Arterial line  Intra-op Plan:   Post-operative Plan: Possible Post-op intubation/ventilation  Informed Consent: I have reviewed the patients History and Physical, chart, labs and discussed the procedure including the risks, benefits and alternatives for the proposed anesthesia with the patient or authorized representative who has indicated his/her understanding and acceptance.     Dental advisory given  Plan Discussed with: CRNA  Anesthesia Plan Comments:          Anesthesia Quick Evaluation

## 2020-01-21 ENCOUNTER — Encounter (HOSPITAL_COMMUNITY): Payer: Self-pay | Admitting: Thoracic Surgery (Cardiothoracic Vascular Surgery)

## 2020-01-21 ENCOUNTER — Inpatient Hospital Stay (HOSPITAL_COMMUNITY): Payer: No Typology Code available for payment source

## 2020-01-21 DIAGNOSIS — I313 Pericardial effusion (noninflammatory): Secondary | ICD-10-CM | POA: Diagnosis not present

## 2020-01-21 DIAGNOSIS — I314 Cardiac tamponade: Secondary | ICD-10-CM | POA: Diagnosis not present

## 2020-01-21 LAB — CBC
HCT: 29.9 % — ABNORMAL LOW (ref 39.0–52.0)
Hemoglobin: 9.8 g/dL — ABNORMAL LOW (ref 13.0–17.0)
MCH: 28.8 pg (ref 26.0–34.0)
MCHC: 32.8 g/dL (ref 30.0–36.0)
MCV: 87.9 fL (ref 80.0–100.0)
Platelets: 124 10*3/uL — ABNORMAL LOW (ref 150–400)
RBC: 3.4 MIL/uL — ABNORMAL LOW (ref 4.22–5.81)
RDW: 12.8 % (ref 11.5–15.5)
WBC: 5.4 10*3/uL (ref 4.0–10.5)
nRBC: 0 % (ref 0.0–0.2)

## 2020-01-21 LAB — POCT I-STAT 7, (LYTES, BLD GAS, ICA,H+H)
Acid-Base Excess: 3 mmol/L — ABNORMAL HIGH (ref 0.0–2.0)
Bicarbonate: 27.1 mmol/L (ref 20.0–28.0)
Calcium, Ion: 1.15 mmol/L (ref 1.15–1.40)
HCT: 28 % — ABNORMAL LOW (ref 39.0–52.0)
Hemoglobin: 9.5 g/dL — ABNORMAL LOW (ref 13.0–17.0)
O2 Saturation: 96 %
Patient temperature: 97.4
Potassium: 4.2 mmol/L (ref 3.5–5.1)
Sodium: 135 mmol/L (ref 135–145)
TCO2: 28 mmol/L (ref 22–32)
pCO2 arterial: 36.4 mmHg (ref 32.0–48.0)
pH, Arterial: 7.477 — ABNORMAL HIGH (ref 7.350–7.450)
pO2, Arterial: 77 mmHg — ABNORMAL LOW (ref 83.0–108.0)

## 2020-01-21 LAB — GLUCOSE, CAPILLARY
Glucose-Capillary: 193 mg/dL — ABNORMAL HIGH (ref 70–99)
Glucose-Capillary: 143 mg/dL — ABNORMAL HIGH (ref 70–99)
Glucose-Capillary: 110 mg/dL — ABNORMAL HIGH (ref 70–99)
Glucose-Capillary: 120 mg/dL — ABNORMAL HIGH (ref 70–99)

## 2020-01-21 LAB — COMPREHENSIVE METABOLIC PANEL
ALT: 100 U/L — ABNORMAL HIGH (ref 0–44)
AST: 39 U/L (ref 15–41)
Albumin: 2.4 g/dL — ABNORMAL LOW (ref 3.5–5.0)
Alkaline Phosphatase: 73 U/L (ref 38–126)
Anion gap: 9 (ref 5–15)
BUN: 19 mg/dL (ref 8–23)
CO2: 23 mmol/L (ref 22–32)
Calcium: 8.2 mg/dL — ABNORMAL LOW (ref 8.9–10.3)
Chloride: 101 mmol/L (ref 98–111)
Creatinine, Ser: 0.85 mg/dL (ref 0.61–1.24)
GFR calc Af Amer: >60 mL/min (ref >60)
GFR calc non Af Amer: >60 mL/min (ref >60)
Glucose, Bld: 142 mg/dL — ABNORMAL HIGH (ref 70–99)
Potassium: 4.1 mmol/L (ref 3.5–5.1)
Sodium: 133 mmol/L — ABNORMAL LOW (ref 135–145)
Total Bilirubin: 0.9 mg/dL (ref 0.3–1.2)
Total Protein: 5.1 g/dL — ABNORMAL LOW (ref 6.5–8.1)

## 2020-01-21 LAB — PH, BODY FLUID: pH, Body Fluid: 6.7

## 2020-01-21 LAB — CYTOLOGY - NON PAP

## 2020-01-21 MED ORDER — FAMOTIDINE 20 MG PO TABS
20.0000 mg | ORAL_TABLET | Freq: Every day | ORAL | Status: DC
  Administered 2020-01-21 – 2020-01-25 (×5): 20 mg via ORAL
  Filled 2020-01-21 (×5): qty 1

## 2020-01-21 MED ORDER — FAMOTIDINE IN NACL 20-0.9 MG/50ML-% IV SOLN
20.0000 mg | Freq: Every day | INTRAVENOUS | Status: DC
  Filled 2020-01-21: qty 50

## 2020-01-21 NOTE — Progress Notes (Signed)
Pt c/o hiccups with tylenol Does not seem to happen with other po meds

## 2020-01-21 NOTE — Progress Notes (Addendum)
[address]       [place] [address]             [phone]      1 Day Post-Op Procedure(s) (LRB): XI ROBOTIC ASSISTED THORACOSCOPY PERICARDIAL WINDOW, double lumen ET tube (Right) Subjective: Awake and alert, says he feels much better since surgery yesterday. Pain controlled.   Objective: Vital signs in last 24 hours: Temp:  [97.4 F (36.3 C)-98.2 F (36.8 C)] 97.4 F (36.3 C) (08/17 0758) Pulse Rate:  [53-83] 67 (08/17 0800) Cardiac Rhythm: Normal sinus rhythm;Sinus bradycardia (08/17 0309) Resp:  [11-21] 21 (08/17 0800) BP: (105-151)/(65-94) 137/77 (08/17 0800) SpO2:  [87 %-99 %] 97 % (08/17 0800) Arterial Line BP: (87-167)/(54-134) 93/65 (08/17 0800) Weight:  [66.9 kg] 66.9 kg (08/17 0244)    Intake/Output from previous day: 08/16 0701 - 08/17 0700 In: 1460.3 [P.O.:480; I.V.:580.3; IV Piggyback:400] Out: 1931 [Urine:1574; Drains:125; Blood:100; Chest Tube:132] Intake/Output this shift: Total I/O In: -  Out: 106 [Urine:85; Drains:10; Stool:1; Chest Tube:10]  General appearance: alert, cooperative and no distress Heart: regular rate and rhythm and Monitor shows NSR with few episodes of brief NSVT.  Lungs: Breath sounds are clear anterior. Drainage from the right pleural and  mediastinal drains is minimal.  Wound: The left chest port sites are covered with a dry dressing.   Lab Results: Recent Labs    01/20/20 0652 01/20/20 0652 01/21/20 0235 01/21/20 0237  WBC 6.8  --  5.4  --   HGB 10.1*   < > 9.8* 9.5*  HCT 31.3*   < > 29.9* 28.0*  PLT 112*  --  124*  --    < > = values in this interval not displayed.   BMET:  Recent Labs    01/20/20 0652 01/20/20 0652 01/21/20 0235 01/21/20 0237  NA 136   < > 133* 135  K 4.5   < > 4.1 4.2  CL 105  --  101  --   CO2 23  --  23  --   GLUCOSE 85  --  142*  --   BUN 23  --  19  --   CREATININE 1.12  --  0.85  --   CALCIUM 8.3*  --  8.2*  --    < > = values in this interval not displayed.      PT/INR:  Recent Labs    01/18/20 2250  LABPROT 18.4*  INR 1.6*   ABG    Component Value Date/Time   PHART 7.477 (H) 01/21/2020 0237   HCO3 27.1 01/21/2020 0237   TCO2 28 01/21/2020 0237   O2SAT 96.0 01/21/2020 0237   CBG (last 3)  Recent Labs    01/20/20 1615 01/20/20 2132 01/21/20 0642  GLUCAP 95 237* 193*     Study Result     EXAM: PORTABLE CHEST 1 VIEW  01/21/20  COMPARISON:  01/20/2020.  01/18/2020.  FINDINGS: Right chest tube and pericardial drainage catheter in stable position. Cardiomegaly. No pulmonary venous congestion. Small ill-defined density noted over the right upper lung. A small pulmonary mass cannot be excluded. Nonenhanced chest CT suggested to further evaluate. Persistent left base infiltrate and atelectasis. Small left pleural effusion again noted. No pneumothorax. Aortic atherosclerotic vascular calcification. Degenerative change thoracic spine. Stable mild right chest wall subcutaneous emphysema. Surgical clips right upper quadrant.  IMPRESSION: 1. Right chest tube and pericardial drainage catheter in stable position. No pneumothorax.  2.  Stable cardiomegaly.  No  pulmonary venous congestion.  3. Persistent left base atelectasis and infiltrate. Persistent small left pleural effusion. No interim change.  4. Small ill defined density noted over the right upper lung. A small pulmonary mass cannot be excluded. Nonenhanced chest CT suggested for further evaluation.  These results will be called to the ordering clinician or representative by the Radiologist Assistant, and communication documented in the PACS or [employer].   Electronically Signed   By: Marcello Moores  Register   On: 01/21/2020 06:46     Assessment/Plan: S/P Procedure(s) (LRB): XI ROBOTIC ASSISTED THORACOSCOPY PERICARDIAL WINDOW, double lumen ET tube (Right)  -POD-1 robotic-assisted right video thoracoscopy for drainage of suspected malignant  pericardial effusion. The closed drain had 79ml out past 12 hours and the pleural tube drained ~4ml for past 12 hours. Cytology is pending. Will leave both tubes in place for now. Expect we can remove the pleural tube tomorrow. No change in management. OK to transfer out of ICU from CTS standpoint.    LOS: 3 days    Antony Odea, [place] [phone] 01/21/2020   Agree with above. We will keep both chest tubes in for now, and encourage incentive spirometry and ambulation. We will remove the pericardial drain first which is attached to the bulb.  We will continue to follow.  Lailyn Appelbaum Bary Leriche

## 2020-01-21 NOTE — Progress Notes (Signed)
[facility] called and thoracentesis done a week or two ago showed poorly differentiated adenocarcinoma of lung origin.  Erskine Emery MD PCCM

## 2020-01-21 NOTE — Progress Notes (Addendum)
   NAME:  Glen Scott, MRN:  [id number], DOB:  March 08, 1946, LOS: 3 ADMISSION DATE:  01/18/2020, CONSULTATION DATE:  01/21/20 REFERRING MD:  EDP, CHIEF COMPLAINT:  Dyspnea and abdominal pain   Brief History   74 y.o. M with PMH of recently diagnosed malignant cells on thoracentesis at the [facility] on 01/08/20, Colitis, bilateral PE on xarelto, AAA who developed dyspnea, abdominal pain, and n/v this morning, so presented to [facility] where CT abd/pelvis showed worsening thrombus in known AAA with paricardial effusion and possible tamponade.  Accepted in transfer to the ED where PCCM was asked to admit  Past Medical History   has a past medical history of AAA (abdominal aortic aneurysm) ([place]), Bilateral pleural effusion, Cigarette smoker, Crohn's disease ([place]), History of pulmonary embolus (PE), Pericardial effusion, and Umbilical hernia.   Significant Hospital Events   8/14 Transfer from [place] to [facility] [place] admit 8/15 Pericardial drain placement  8/16: going to OR later today for pericardial window  Consults:  Cardiology CTS  Procedures:    Significant Diagnostic Tests:  8/14 CXR>>Mild left basilar atelectasis and/or infiltrate.  Small left pleural effusion.  Micro Data:  8/14 Sars-CoV-2>>neg  Antimicrobials:     Interim history/subjective:  Still has some discomfort with deep breath otherwise no complaints  Objective   Blood pressure 137/77, pulse 67, temperature (Abnormal) 97.4 F (36.3 C), temperature source Oral, resp. rate (Abnormal) 21, height 5\' 8"  (1.727 m), weight 66.9 kg, SpO2 97 %.        Intake/Output Summary (Last 24 hours) at 01/21/2020 0953 Last data filed at 01/21/2020 6144 Gross per 24 hour  Intake 1460.28 ml  Output 1812 ml  Net -351.72 ml   Filed Weights   01/19/20 0550 01/20/20 0600 01/21/20 0244  Weight: 67.7 kg 65 kg 66.9 kg   Physical Exam: General very pleasant 74 year old white male resting in bed no acute distress HEENT normocephalic  atraumatic no jugular venous distention appreciated Pulmonary: Clear to auscultation no accessory use currently 2 L nasal cannula Cardiac: Regular rate and rhythm.  Pericardial window drain intact with bloody serous output Abdomen: Soft nontender no organomegaly Extremities: Warm dry brisk cap refill Neuro: Awake oriented, no focal deficits. Resolved Hospital Problem list     Assessment & Plan:   Malignant pericardial effusion with tamponade s/p pericardial drain 8/14 & pericardial window per CVTS Patient currently being worked up at the [place] in [place] for probable lung cancer (+malignant pleural effusion with spiculated lung mass). Imaging not available for review. Plan Drain management per CT surg.  F/u cytology   Recent bilateral PE Plan Holding [facility] for now. Need to resume as soon as ok w/ thoracic team    AAA with increasing thrombus PCCM attending spoke with vascular on call on admission -no emergent intervention needed overnight Plan Resume AC when ok w/ surg team   Mild anemia w/out bleeding Plan Trend cbc  Hyperglycemia Plan ssi Best practice:  Diet: NPO at midnight Pain/Anxiety/Delirium protocol (if indicated): prn fentanyl VAP protocol (if indicated): n/a DVT prophylaxis: SCD's GI prophylaxis: n/a Glucose control: SSI Mobility: bed rest Code Status: full code Family Communication: Updated patient at bedside on 8/15 Disposition: Progressive cardiothoracic unit d/t drain management will ask internal medicine to assume care   Erick Colace ACNP-BC [place] Pager # [phone] OR # [phone] if no answer

## 2020-01-21 NOTE — Progress Notes (Signed)
Progress Note  Patient Name: Glen Scott Date of Encounter: 01/21/2020  Primary Cardiologist: Fransico Him, MD  Subjective   Only having chest pain with deep inspiration  Inpatient Medications    Scheduled Meds: . acetaminophen  1,000 mg Oral Q6H   Or  . acetaminophen (TYLENOL) oral liquid 160 mg/5 mL  1,000 mg Oral Q6H  . bisacodyl  10 mg Oral Daily  . bupivacaine liposome  20 mL Infiltration Once  . Chlorhexidine Gluconate Cloth  6 each Topical Daily  . Chlorhexidine Gluconate Cloth  6 each Topical Q0600  . colchicine  0.6 mg Oral BID  . dextromethorphan-guaiFENesin  1 tablet Oral BID  . enoxaparin (LOVENOX) injection  40 mg Subcutaneous Daily  . feeding supplement (ENSURE ENLIVE)  237 mL Oral BID BM  . insulin aspart  0-24 Units Subcutaneous TID AC & HS  . ketorolac  15 mg Intravenous Q6H  . mouth rinse  15 mL Mouth Rinse BID  . mupirocin ointment  1 application Nasal BID  . senna-docusate  1 tablet Oral QHS  . sodium chloride flush  3 mL Intravenous Q12H   Continuous Infusions: . sodium chloride    . lactated ringers 10 mL/hr at 01/21/20 0200   PRN Meds: Place/Maintain arterial line **AND** sodium chloride, docusate sodium, fentaNYL (SUBLIMAZE) injection, ondansetron (ZOFRAN) IV, polyethylene glycol, sodium chloride flush, traMADol   Vital Signs    Vitals:   01/21/20 0500 01/21/20 0600 01/21/20 0700 01/21/20 0758  BP: (!) 150/80 (!) 151/82 (!) 146/79   Pulse: 62 77 71   Resp: 11 18 17    Temp:    (!) 97.4 F (36.3 C)  TempSrc:    Oral  SpO2: 96% 93% 96%   Weight:      Height:        Intake/Output Summary (Last 24 hours) at 01/21/2020 0853 Last data filed at 01/21/2020 0800 Gross per 24 hour  Intake 1460.28 ml  Output 1901 ml  Net -440.72 ml   Last 3 Weights 01/21/2020 01/20/2020 01/19/2020  Weight (lbs) 147 lb 7.8 oz 143 lb 4.8 oz 149 lb 4 oz  Weight (kg) 66.9 kg 65 kg 67.7 kg     Telemetry    sinus - Personally Reviewed  Physical Exam    General: Well developed, well nourished, NAD  HEENT: OP clear, mucus membranes moist  SKIN: warm, dry. No rashes. Neuro: No focal deficits  Musculoskeletal: Muscle strength 5/5 all ext  Psychiatric: Mood and affect normal  Neck: No JVD, no carotid bruits, no thyromegaly, no lymphadenopathy.  Lungs:Clear bilaterally, no wheezes, rhonci, crackles Cardiovascular: Regular rate and rhythm. No murmurs, gallops or rubs. Abdomen:Soft. Bowel sounds present. Non-tender.  Extremities: No lower extremity edema. Pulses are 2 + in the bilateral DP/PT.  Labs    High Sensitivity Troponin:   Recent Labs  Lab 01/18/20 2250 01/19/20 0309  TROPONINIHS 12 15      Cardiac EnzymesNo results for input(s): TROPONINI in the last 168 hours. No results for input(s): TROPIPOC in the last 168 hours.   Chemistry Recent Labs  Lab 01/18/20 2250 01/18/20 2250 01/20/20 0652 01/21/20 0235 01/21/20 0237  NA 133*   < > 136 133* 135  K 4.5   < > 4.5 4.1 4.2  CL 101  --  105 101  --   CO2 17*  --  23 23  --   GLUCOSE 130*  --  85 142*  --   BUN 28*  --  23 19  --  CREATININE 1.64*  --  1.12 0.85  --   CALCIUM 8.4*  --  8.3* 8.2*  --   PROT 5.5*  --   --  5.1*  --   ALBUMIN 2.8*  --   --  2.4*  --   AST 115*  --   --  39  --   ALT 115*  --   --  100*  --   ALKPHOS 73  --   --  73  --   BILITOT 1.0  --   --  0.9  --   GFRNONAA 41*  --  >60 >60  --   GFRAA 48*  --  >60 >60  --   ANIONGAP 15  --  8 9  --    < > = values in this interval not displayed.     Hematology Recent Labs  Lab 01/18/20 2250 01/18/20 2250 01/20/20 0652 01/21/20 0235 01/21/20 0237  WBC 16.9*  --  6.8 5.4  --   RBC 3.82*  --  3.45* 3.40*  --   HGB 11.1*   < > 10.1* 9.8* 9.5*  HCT 37.1*   < > 31.3* 29.9* 28.0*  MCV 97.1  --  90.7 87.9  --   MCH 29.1  --  29.3 28.8  --   MCHC 29.9*  --  32.3 32.8  --   RDW 13.4  --  13.2 12.8  --   PLT 251  --  112* 124*  --    < > = values in this interval not displayed.    BNPNo  results for input(s): BNP, PROBNP in the last 168 hours.   DDimer No results for input(s): DDIMER in the last 168 hours.   Radiology    DG Chest Port 1 View  Result Date: 01/21/2020 CLINICAL DATA:  Chest tube.  Pericardial effusion.  Sore chest. EXAM: PORTABLE CHEST 1 VIEW COMPARISON:  01/20/2020.  01/18/2020. FINDINGS: Right chest tube and pericardial drainage catheter in stable position. Cardiomegaly. No pulmonary venous congestion. Small ill-defined density noted over the right upper lung. A small pulmonary mass cannot be excluded. Nonenhanced chest CT suggested to further evaluate. Persistent left base infiltrate and atelectasis. Small left pleural effusion again noted. No pneumothorax. Aortic atherosclerotic vascular calcification. Degenerative change thoracic spine. Stable mild right chest wall subcutaneous emphysema. Surgical clips right upper quadrant. IMPRESSION: 1. Right chest tube and pericardial drainage catheter in stable position. No pneumothorax. 2.  Stable cardiomegaly.  No pulmonary venous congestion. 3. Persistent left base atelectasis and infiltrate. Persistent small left pleural effusion. No interim change. 4. Small ill defined density noted over the right upper lung. A small pulmonary mass cannot be excluded. Nonenhanced chest CT suggested for further evaluation. These results will be called to the ordering clinician or representative by the Radiologist Assistant, and communication documented in the PACS or [employer]. Electronically Signed   By: Marcello Moores  Register   On: 01/21/2020 06:46   DG Chest Port 1 View  Result Date: 01/20/2020 CLINICAL DATA:  Pericardial effusion. EXAM: PORTABLE CHEST 1 VIEW COMPARISON:  One-view chest x-ray 01/18/2020 FINDINGS: Is straightening the left heart shadow. Mild pulmonary vascular congestion present. Left pleural effusion is noted. Left basilar airspace disease present. Atherosclerotic changes are noted at the aortic arch. IMPRESSION: 1. Mild  pulmonary vascular congestion and left pleural effusion. 2. Left basilar airspace disease likely reflects atelectasis. Electronically Signed   By: San Morelle M.D.   On: 01/20/2020 15:43   ECHOCARDIOGRAM  LIMITED  Result Date: 01/19/2020    ECHOCARDIOGRAM LIMITED REPORT   Patient Name:   Glen Scott Date of Exam: 01/19/2020 Medical Rec #:  [id number]  Height:       68.0 in Accession #:    [id number] Weight:       149.3 lb Date of Birth:  01-Jun-1946  BSA:          1.805 m Patient Age:    74 years   BP:           148/79 mmHg Patient Gender: M          HR:           81 bpm. Exam Location:  Inpatient Procedure: Limited Echo Indications:    Pericardial effusion 423.9 / I31.3  History:        Patient has prior history of Echocardiogram examinations, most                 recent 01/18/2020. AAA. Pericardial effusion.  Sonographer:    Vickie Epley RDCS Referring Phys: [place]  1. Left ventricular ejection fraction, by estimation, is 65 to 70%. The left ventricle has normal function. The left ventricle has no regional wall motion abnormalities.  2. Right ventricular systolic function is normal. The right ventricular size is normal.  3. Pericardial effusion s/p drainage, now very small circumferential fluid remaining. Moderate pleural effusion in both left and right lateral regions.  4. The inferior vena cava is normal in size with greater than 50% respiratory variability, suggesting right atrial pressure of 3 mmHg. Conclusion(s)/Recommendation(s): S/P pericardiocentesis, with only very small circumferential effusion remaining. No evidence of tamponade, IVC normal and collapses, no apparent RA/RV collapse. FINDINGS  Left Ventricle: Left ventricular ejection fraction, by estimation, is 65 to 70%. The left ventricle has normal function. The left ventricle has no regional wall motion abnormalities. The left ventricular internal cavity size was normal in size. Right Ventricle: The right ventricular  size is normal. No increase in right ventricular wall thickness. Right ventricular systolic function is normal. Pericardium: Pericardial effusion s/p drainage, now very small circumferential fluid remaining. A small pericardial effusion is present. Venous: The inferior vena cava is normal in size with greater than 50% respiratory variability, suggesting right atrial pressure of 3 mmHg. Additional Comments: There is a moderate pleural effusion in both left and right lateral regions. Buford Dresser MD Electronically signed by Buford Dresser MD Signature Date/Time: 01/19/2020/3:37:49 PM    Final     Cardiac Studies   2D echo 01/18/20 1. Left ventricular ejection fraction, by estimation, is 65 to 70%. The  left ventricle has normal function. The left ventricle has no regional  wall motion abnormalities. Left ventricular diastolic function could not  be evaluated.  2. Right ventricular systolic function is normal. The right ventricular  size is normal. There is normal pulmonary artery systolic pressure. The  estimated right ventricular systolic pressure is 70.3 mmHg.  3. The mitral valve is normal in structure. No evidence of mitral valve  regurgitation. No evidence of mitral stenosis.  4. The aortic valve is normal in structure. Aortic valve regurgitation is  not visualized. No aortic stenosis is present.  5. The inferior vena cava is dilated in size with <50% respiratory  variability, suggesting right atrial pressure of 15 mmHg.  6. Larger circumferential pericardial effusion measuring 2.79 x 3.33cm at  greatest diameter. There is RA inversion. Possible subtle RV diastolic  collapse. The RV cavity is small.  Borderline respirophasic changes in MV  inflow velocities with a 42mmHg  difference. The IVC is dilated with < 50% respirophasic change. Findings  worrisome for cardiac tamponade. . Large pericardial effusion. The  pericardial effusion is circumferential. Moderate pleural  effusion in the  left lateral region.   Patient Profile     74 y.o. male with history of  AAA, Crohn's disease longstanding tobacco smoker with recently diagnosed pulmonary embolus (presumably on August 6, placed on Xarelto) as well as likely lung cancer (malignant cells seen on recent thoracentesis at [facility]). He presented to the hospital with upper abdominal pain to his back, dyspnea, nausea and vomiting. He went to the [facility] where imaging reviewed at enlarging pericardial effusion with likely tamponade physiology as well as increased thrombus and AAA.  He was was transferred from [facility] to [facility] ER to ER transfer due to large pericardial effusion noted on CT scan. 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  There was concern for possible leaking aneurysm or inflammatory aneurysm/mycotic aneurysm cannot rule out. Vascular surgery was contacted with no plans for immediate intervention of the AAA thrombus. He was seen by the cardiology team and underwent pericardiocentesis with -1080 bloody fluid drained. Robotic pericardial window 01/20/20.  Assessment & Plan    1. 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 Drain in place today. CT surgery following. Cytology pending.   2. Recently diagnosed PE and lung cancer: Anti-coagulation on hold following surgical procedure.   3. AAA with thrombus: Will need follow up with Vascular surgery after discharge.   For questions or updates, please contact [place] Please consult [email or web address] for contact info under Cardiology/STEMI.  Signed, Lauree Chandler, MD 01/21/2020,  8:53 AM

## 2020-01-21 NOTE — Progress Notes (Signed)
PT Cancellation Note  Patient Details Name: Glen Scott MRN: [id number] DOB: 1945-08-05   Cancelled Treatment:    Reason Eval/Treat Not Completed: Other (comment) Pt declined PT eval at this time.  Reports he has been OOB with nursing staff a couple times today.  Declined due to pain at chest tube site (reports just had coughing episode).  Request pain meds - notified RN.  Abran Richard, PT Acute Rehab Services Pager [phone] [facility] [place] 01/21/2020, 5:02 PM

## 2020-01-21 NOTE — Progress Notes (Signed)
Initial Nutrition Assessment  DOCUMENTATION CODES:   Severe malnutrition in context of chronic illness  INTERVENTION:   Liberalize diet to REGULAR   Ensure Enlive po BID, each supplement provides 350 kcal and 20 grams of protein  Magic cup BID with meals, each supplement provides 290 kcal and 9 grams of protein  MVI daily   NUTRITION DIAGNOSIS:   Severe Malnutrition related to chronic illness (Crohns disease) as evidenced by moderate fat depletion, severe muscle depletion, energy intake < or equal to 75% for > or equal to 1 month.  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 Presents this admission with pericardial effusion with tamponade.   8/16- R thoracoscopy for drainage of suspected malignant pericardial effusion  Pt denies loss in appetite PTA. Typically consumes two meals daily that consist of pizza, spaghetti, pasta, hotdogs, country style steak, hamburgers, and carrots (portions tend to be smaller in size). Reports having multiple bowel resections due to Crohn's but this is not reflected in his chart. Does not avoid any foods other than spicy options. Pt did not drink supplementation PTA but has tried Ensure this admission and likes it. He typically wears dentures but does not want to bring them as he dislikes having to take them out and put them in. Asked if he would like diet to be downgraded to chopped consistency for chewing ease but he wishes to stay on regular texture. No meal completions documented. RD observed lunch tray with 1/2 hamburger left.   Pt reports his doctor that performed back surgery told him to keep his weight around 145 lb so his UBW stays around  there. He feels best at 160 lb. Records lack weight history.   Drips: LR @ 10 ml/hr  Medications: dulcolax, SS novolog, senokot Labs: CBG 85-237  NUTRITION - FOCUSED PHYSICAL EXAM:    Most Recent Value  Orbital Region Mild depletion  Upper Arm Region Severe depletion  Thoracic and Lumbar Region Mild depletion  Buccal Region Moderate depletion  Temple Region Severe depletion  Clavicle Bone Region Severe depletion  Clavicle and Acromion Bone Region Severe depletion  Scapular Bone Region Moderate depletion  Dorsal Hand No depletion  Patellar Region Severe depletion  Anterior Thigh Region Severe depletion  Posterior Calf Region Severe depletion  Edema (RD Assessment) Mild  Hair Reviewed  Eyes Reviewed  Mouth Reviewed  Skin Reviewed  Nails Reviewed     Diet Order:   Diet Order            Diet heart healthy/carb modified Room service appropriate? Yes; Fluid consistency: Thin  Diet effective now                 EDUCATION NEEDS:   Education needs have been addressed  Skin:  Skin Assessment: Skin Integrity Issues: Skin Integrity Issues:: Incisions Incisions: chest  Last BM:  8/17  Height:   Ht Readings from Last 1 Encounters:  01/18/20 5\' 8"  (1.727 m)    Weight:   Wt Readings from Last 1 Encounters:  01/21/20 66.9 kg     BMI:  Body mass index is 22.43 kg/m.  Estimated Nutritional Needs:   Kcal:  2150-2350 kcal  Protein:  110-125 grams  Fluid:  >/= 2 L/day  Mariana Single RD, LDN Clinical Nutrition Pager listed in [place]

## 2020-01-22 DIAGNOSIS — E43 Unspecified severe protein-calorie malnutrition: Secondary | ICD-10-CM | POA: Insufficient documentation

## 2020-01-22 LAB — COMPREHENSIVE METABOLIC PANEL
ALT: 81 U/L — ABNORMAL HIGH (ref 0–44)
AST: 31 U/L (ref 15–41)
Albumin: 2.3 g/dL — ABNORMAL LOW (ref 3.5–5.0)
Alkaline Phosphatase: 73 U/L (ref 38–126)
Anion gap: 6 (ref 5–15)
BUN: 16 mg/dL (ref 8–23)
CO2: 27 mmol/L (ref 22–32)
Calcium: 8.1 mg/dL — ABNORMAL LOW (ref 8.9–10.3)
Chloride: 102 mmol/L (ref 98–111)
Creatinine, Ser: 0.84 mg/dL (ref 0.61–1.24)
GFR calc Af Amer: >60 mL/min (ref >60)
GFR calc non Af Amer: >60 mL/min (ref >60)
Glucose, Bld: 98 mg/dL (ref 70–99)
Potassium: 3.4 mmol/L — ABNORMAL LOW (ref 3.5–5.1)
Sodium: 135 mmol/L (ref 135–145)
Total Bilirubin: 0.6 mg/dL (ref 0.3–1.2)
Total Protein: 4.9 g/dL — ABNORMAL LOW (ref 6.5–8.1)

## 2020-01-22 LAB — CBC
HCT: 32.4 % — ABNORMAL LOW (ref 39.0–52.0)
Hemoglobin: 10.8 g/dL — ABNORMAL LOW (ref 13.0–17.0)
MCH: 29.3 pg (ref 26.0–34.0)
MCHC: 33.3 g/dL (ref 30.0–36.0)
MCV: 87.8 fL (ref 80.0–100.0)
Platelets: 150 10*3/uL (ref 150–400)
RBC: 3.69 MIL/uL — ABNORMAL LOW (ref 4.22–5.81)
RDW: 13 % (ref 11.5–15.5)
WBC: 4.7 10*3/uL (ref 4.0–10.5)
nRBC: 0 % (ref 0.0–0.2)

## 2020-01-22 LAB — SURGICAL PATHOLOGY

## 2020-01-22 LAB — GLUCOSE, CAPILLARY
Glucose-Capillary: 104 mg/dL — ABNORMAL HIGH (ref 70–99)
Glucose-Capillary: 85 mg/dL (ref 70–99)
Glucose-Capillary: 82 mg/dL (ref 70–99)
Glucose-Capillary: 82 mg/dL (ref 70–99)

## 2020-01-22 MED ORDER — POTASSIUM CHLORIDE CRYS ER 20 MEQ PO TBCR
40.0000 meq | EXTENDED_RELEASE_TABLET | Freq: Once | ORAL | Status: AC
  Administered 2020-01-22: 40 meq via ORAL
  Filled 2020-01-22: qty 2

## 2020-01-22 MED ORDER — DIPHENHYDRAMINE HCL 25 MG PO CAPS
25.0000 mg | ORAL_CAPSULE | Freq: Every evening | ORAL | Status: DC | PRN
  Administered 2020-01-22: 25 mg via ORAL
  Filled 2020-01-22: qty 1

## 2020-01-22 NOTE — Progress Notes (Signed)
PROGRESS NOTE    Glen Scott  GQQ:[id number] DOB: 08/06/1945 DOA: 01/18/2020 PCP: Leisa Lenz, MD    Chief Complaint  Patient presents with  . Shortness of Breath    Brief Narrative:  74 year old gentleman with malignant cells on thoracentesis at the [facility] on 01/08/2020 bilateral PE on xarelto, AAA, developed dyspnea, abd pain. CT abd pelvis shows worsening thrombus in known AAA patient, and pericardial effusion with possible tamponade. He was admitted to [facility] service and transferred to [facility] on 01/22/2020. Cardiology, TCTS ON board.  Assessment & Plan:   Principal Problem:   Pericardial effusion with cardiac tamponade Active Problems:   Bilateral pulmonary embolism (HCC)   Pleural effusion, left   AAA (abdominal aortic aneurysm) without rupture (HCC)   Protein-calorie malnutrition, severe   Suspected pericardial effusions/p robotic assisted right video thoracoscopy for drainage of malig pericardial effusion.  -s/p chest tube placement . - cytology pending.  - drain management as per TCTS.     Severe protein calorie malnutrition:  Nutritionist consulted and recommendations given.    Bilateral PE;  Currently on prophylactic dose of lovenox, defer to vascular for therapeutic dose of lovenox.     AAA with thrombus.  Further management by TCTS.   Hypokalemia; Replaced.    Mild normocytic anemia: anemia of blood loss from pericardial window.  Hemoglobin around 10      DVT prophylaxis: (Lovenox) Code Status: Full code.  Family Communication: none at bedside.  Disposition:   Status is: Inpatient  Remains inpatient appropriate because:Ongoing diagnostic testing needed not appropriate for outpatient work up   Dispo: The patient is from: Home              Anticipated d/c is to: Home              Anticipated d/c date is: 1 day              Patient currently is not medically stable to d/c.       Consultants:   PCCM   Tcts.    Procedures:   Robotic assisted thoracoscopy pericardial window.    Antimicrobials: none.    Subjective: No new complaints.   Objective: Vitals:   01/22/20 0002 01/22/20 0431 01/22/20 0759 01/22/20 1112  BP: 136/66 129/71 123/70 139/76  Pulse: 73 76 72 64  Resp: 17 17 11 20   Temp: 98.3 F (36.8 C) 98.4 F (36.9 C) 98.4 F (36.9 C) (!) 97.5 F (36.4 C)  TempSrc: Oral Oral Oral Oral  SpO2: 92% 92% 93% 97%  Weight:  65.5 kg    Height:        Intake/Output Summary (Last 24 hours) at 01/22/2020 1538 Last data filed at 01/22/2020 1041 Gross per 24 hour  Intake --  Output 1040 ml  Net -1040 ml   Filed Weights   01/20/20 0600 01/21/20 0244 01/22/20 0431  Weight: 65 kg 66.9 kg 65.5 kg    Examination:  General exam: Appears calm and comfortable  Respiratory system: diminished air entry at bases.  Cardiovascular system: S1 & S2 heard, RRR. No JVD, Gastrointestinal system: Abdomen is nondistended, soft and nontender. No organomegaly or masses felt. Normal bowel sounds heard. Central nervous system:woke up briefly and answered few questions and went back to sleep.  Extremities: Symmetric 5 x 5 power. Skin: No rashes Psychiatry:  Mood & affect appropriate.     Data Reviewed: I have personally reviewed following labs and imaging studies  CBC: Recent Labs  Lab  01/18/20 2250 01/20/20 4128 01/21/20 0235 01/21/20 0237 01/22/20 0428  WBC 16.9* 6.8 5.4  --  4.7  HGB 11.1* 10.1* 9.8* 9.5* 10.8*  HCT 37.1* 31.3* 29.9* 28.0* 32.4*  MCV 97.1 90.7 87.9  --  87.8  PLT 251 112* 124*  --  786    Basic Metabolic Panel: Recent Labs  Lab 01/18/20 2250 01/20/20 0652 01/21/20 0235 01/21/20 0237 01/22/20 0428  NA 133* 136 133* 135 135  K 4.5 4.5 4.1 4.2 3.4*  CL 101 105 101  --  102  CO2 17* 23 23  --  27  GLUCOSE 130* 85 142*  --  98  BUN 28* 23 19  --  16  CREATININE 1.64* 1.12 0.85  --  0.84  CALCIUM 8.4* 8.3* 8.2*  --  8.1*    GFR: Estimated Creatinine Clearance: 73.6 mL/min  (by C-G formula based on SCr of 0.84 mg/dL).  Liver Function Tests: Recent Labs  Lab 01/18/20 2250 01/21/20 0235 01/22/20 0428  AST 115* 39 31  ALT 115* 100* 81*  ALKPHOS 73 73 73  BILITOT 1.0 0.9 0.6  PROT 5.5* 5.1* 4.9*  ALBUMIN 2.8* 2.4* 2.3*    CBG: Recent Labs  Lab 01/21/20 1137 01/21/20 1613 01/21/20 2111 01/22/20 0647 01/22/20 1050  GLUCAP 143* 110* 120* 104* 85     Recent Results (from the past 240 hour(s))  SARS Coronavirus 2 by RT PCR (hospital order, performed in [facility] hospital lab) Nasopharyngeal Nasopharyngeal Swab     Status: None   Collection Time: 01/18/20 10:49 PM   Specimen: Nasopharyngeal Swab  Result Value Ref Range Status   SARS Coronavirus 2 NEGATIVE NEGATIVE Final    Comment: (NOTE) SARS-CoV-2 target nucleic acids are NOT DETECTED.  The SARS-CoV-2 RNA is generally detectable in upper and lower respiratory specimens during the acute phase of infection. The lowest concentration of SARS-CoV-2 viral copies this assay can detect is 250 copies / mL. A negative result does not preclude SARS-CoV-2 infection and should not be used as the sole basis for treatment or other patient management decisions.  A negative result may occur with improper specimen collection / handling, submission of specimen other than nasopharyngeal swab, presence of viral mutation(s) within the areas targeted by this assay, and inadequate number of viral copies (<250 copies / mL). A negative result must be combined with clinical observations, patient history, and epidemiological information.  Fact Sheet for Patients:   StrictlyIdeas.no  Fact Sheet for Healthcare Providers: BankingDealers.co.za  This test is not yet approved or  cleared by the [place] FDA and has been authorized for detection and/or diagnosis of SARS-CoV-2 by FDA under an Emergency Use Authorization (EUA).  This EUA will remain in effect (meaning  this test can be used) for the duration of the COVID-19 declaration under Section 564(b)(1) of the Act, 21 U.S.C. section 360bbb-3(b)(1), unless the authorization is terminated or revoked sooner.  Performed at [facility], [place] [address] [place], [place] [address]   MRSA PCR Screening     Status: None   Collection Time: 01/19/20  1:46 AM   Specimen: Nasal Mucosa; Nasopharyngeal  Result Value Ref Range Status   MRSA by PCR NEGATIVE NEGATIVE Final    Comment:        The GeneXpert MRSA Assay (FDA approved for NASAL specimens only), is one component of a comprehensive MRSA colonization surveillance program. It is not intended to diagnose MRSA infection nor to guide or monitor treatment for MRSA  infections. Performed at [facility], [place] [address] [place], [place] [address]   Gram stain     Status: None   Collection Time: 01/19/20  5:09 AM   Specimen: Fluid  Result Value Ref Range Status   Specimen Description FLUID PERICARDIUM  Final   Special Requests NONE  Final   Gram Stain   Final    MODERATE WBC PRESENT,BOTH PMN AND MONONUCLEAR NO ORGANISMS SEEN Performed at [facility], [address] [place], [place] [address]    Report Status 01/19/2020 FINAL  Final  Surgical pcr screen     Status: Abnormal   Collection Time: 01/20/20 11:49 AM   Specimen: Nasal Mucosa; Nasal Swab  Result Value Ref Range Status   MRSA, PCR NEGATIVE NEGATIVE Final   Staphylococcus aureus POSITIVE (A) NEGATIVE Final    Comment: (NOTE) The Xpert SA Assay (FDA approved for NASAL specimens in patients 7 years of age and older), is one component of a comprehensive surveillance program. It is not intended to diagnose infection nor to guide or monitor treatment. Performed at [facility], [place] [address] [place], [place] [address]          Radiology Studies: DG Chest Port 1 View  Result Date: 01/21/2020 CLINICAL DATA:  Chest tube.  Pericardial effusion.  Sore  chest. EXAM: PORTABLE CHEST 1 VIEW COMPARISON:  01/20/2020.  01/18/2020. FINDINGS: Right chest tube and pericardial drainage catheter in stable position. Cardiomegaly. No pulmonary venous congestion. Small ill-defined density noted over the right upper lung. A small pulmonary mass cannot be excluded. Nonenhanced chest CT suggested to further evaluate. Persistent left base infiltrate and atelectasis. Small left pleural effusion again noted. No pneumothorax. Aortic atherosclerotic vascular calcification. Degenerative change thoracic spine. Stable mild right chest wall subcutaneous emphysema. Surgical clips right upper quadrant. IMPRESSION: 1. Right chest tube and pericardial drainage catheter in stable position. No pneumothorax. 2.  Stable cardiomegaly.  No pulmonary venous congestion. 3. Persistent left base atelectasis and infiltrate. Persistent small left pleural effusion. No interim change. 4. Small ill defined density noted over the right upper lung. A small pulmonary mass cannot be excluded. Nonenhanced chest CT suggested for further evaluation. These results will be called to the ordering clinician or representative by the Radiologist Assistant, and communication documented in the PACS or [employer]. Electronically Signed   By: Marcello Moores  Register   On: 01/21/2020 06:46        Scheduled Meds: . acetaminophen  1,000 mg Oral Q6H   Or  . acetaminophen (TYLENOL) oral liquid 160 mg/5 mL  1,000 mg Oral Q6H  . bisacodyl  10 mg Oral Daily  . bupivacaine liposome  20 mL Infiltration Once  . Chlorhexidine Gluconate Cloth  6 each Topical Daily  . Chlorhexidine Gluconate Cloth  6 each Topical Q0600  . colchicine  0.6 mg Oral BID  . dextromethorphan-guaiFENesin  1 tablet Oral BID  . enoxaparin (LOVENOX) injection  40 mg Subcutaneous Daily  . famotidine  20 mg Oral Daily  . feeding supplement (ENSURE ENLIVE)  237 mL Oral BID BM  . insulin aspart  0-24 Units Subcutaneous TID AC & HS  . ketorolac  15 mg  Intravenous Q6H  . mouth rinse  15 mL Mouth Rinse BID  . mupirocin ointment  1 application Nasal BID  . senna-docusate  1 tablet Oral QHS  . sodium chloride flush  3 mL Intravenous Q12H   Continuous Infusions: . sodium chloride    . lactated  ringers 10 mL/hr at 01/21/20 0200     LOS: 4 days        Hosie Poisson, MD [facility]   To contact the attending provider between 7A-7P or the covering provider during after hours 7P-7A, please log into the web site [email or web address] and access using universal [place] password for that web site. If you do not have the password, please call the hospital operator.  01/22/2020, 3:38 PM

## 2020-01-22 NOTE — Evaluation (Signed)
Occupational Therapy Evaluation Patient Details Name: Glen Scott MRN: [id number] DOB: 1946/05/17 Today's Date: 01/22/2020    History of Present Illness 74 y.o. 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  Imaging revealed enlarging pericardial effusion with likely tamponade physiology as well as increased thrombus and AAA. Pt now s/p thoracoscopy pericardial window on 8/16.    Clinical Impression   This 74 y/o male presents with the above. PTA pt completing ADL and mobility tasks independently. Pt mostly with limitations due to pain/discomfort at this time. Pt performing bed mobility and functional transfers at supervision level today without AD. He currently requires minA for LB ADL and up to maxA for toileting ADL given limited reaching/ROM with dominant RUE for pericare. VSS throughout on RA. Pt reports will return home with significant other (girlfriend) who is able to provide 24hr supervision/assist. He will benefit from continued acute OT services and currently recommend follow up [facility] services after discharge to progress pt's safety and independence with ADL and mobility.    Follow Up Recommendations  Home health OT;Supervision/Assistance - 24 hour (may progress to no follow up )    Equipment Recommendations  Tub/shower seat           Precautions / Restrictions Precautions Precautions: Fall Precaution Comments: chest tube, drain at R flank Restrictions Weight Bearing Restrictions: No      Mobility Bed Mobility Overal bed mobility: Needs Assistance Bed Mobility: Supine to Sit;Sit to Supine     Supine to sit: Supervision Sit to supine: Supervision   General bed mobility comments: for lines management, increased effort to bring LEs back onto EOB  Transfers Overall transfer level: Needs  assistance Equipment used: None Transfers: Sit to/from Stand Sit to Stand: Supervision         General transfer comment: for safety and lines, no imbalances noted, sit<>stand x2 from EOB    Balance Overall balance assessment: Needs assistance Sitting-balance support: Feet supported Sitting balance-Leahy Scale: Good     Standing balance support: No upper extremity supported;During functional activity Standing balance-Leahy Scale: Fair Standing balance comment: supervision-minguard for safety                           ADL either performed or assessed with clinical judgement   ADL Overall ADL's : Needs assistance/impaired Eating/Feeding: Modified independent;Sitting   Grooming: Supervision/safety;Sitting Grooming Details (indicate cue type and reason): seated EOB Upper Body Bathing: Min guard;Sitting   Lower Body Bathing: Minimal assistance;Sit to/from stand   Upper Body Dressing : Min guard;Sitting Upper Body Dressing Details (indicate cue type and reason): donning gown Lower Body Dressing: Minimal assistance;Sit to/from stand Lower Body Dressing Details (indicate cue type and reason): pt able to perform firgure 4 - educated in use of method for LB ADL to manage pain levels Toilet Transfer: Supervision/safety;Stand-pivot Armed forces technical officer Details (indicate cue type and reason): simulated via transfer to/from EOB Toileting- Clothing Manipulation and Hygiene: Maximal assistance;Sitting/lateral lean;Sit to/from stand Toileting - Clothing Manipulation Details (indicate cue type and reason): pt typically R hand dominant and unable to reach back with R hand for posterior pericare given discomfort from chest tub/drain      Functional mobility during ADLs: Supervision/safety  Pertinent Vitals/Pain Pain Assessment: 0-10 Pain Score: 8  Faces Pain Scale: Hurts even more Pain Location: R chest Pain Descriptors / Indicators: Sharp Pain  Intervention(s): Limited activity within patient's tolerance;Monitored during session;Premedicated before session     Hand Dominance Right   Extremity/Trunk Assessment Upper Extremity Assessment Upper Extremity Assessment: Overall WFL for tasks assessed   Lower Extremity Assessment Lower Extremity Assessment: Defer to PT evaluation   Cervical / Trunk Assessment Cervical / Trunk Assessment: Normal   Communication Communication Communication: No difficulties   Cognition Arousal/Alertness: Awake/alert Behavior During Therapy: WFL for tasks assessed/performed Overall Cognitive Status: Within Functional Limits for tasks assessed                                     General Comments  VSS on RA    Exercises     Shoulder Instructions      Home Living Family/patient expects to be discharged to:: Private residence Living Arrangements: Spouse/significant other Available Help at Discharge: Family;Available 24 hours/day Type of Home: House Home Access: Stairs to enter [employer] of Steps: 3 Entrance Stairs-Rails: Right;Can reach both;Left Home Layout: One level     Bathroom Shower/Tub: Tub only;Walk-in shower   Bathroom Toilet: Handicapped height     Home Equipment: Grab bars - tub/shower;Shower seat - built in          Prior Functioning/Environment Level of Independence: Independent        Comments: works in Film/video editor Problem List: Decreased strength;Decreased range of motion;Decreased activity tolerance;Impaired balance (sitting and/or standing);Decreased knowledge of use of DME or AE;Decreased knowledge of precautions;Cardiopulmonary status limiting activity;Pain      OT Treatment/Interventions: Self-care/ADL training;Therapeutic exercise;Energy conservation;DME and/or AE instruction;Therapeutic activities;Patient/family education;Balance training    OT Goals(Current goals can be found in the care plan section) Acute  Rehab OT Goals Patient Stated Goal: less pain, return home OT Goal Formulation: With patient Time For Goal Achievement: 02/05/20 Potential to Achieve Goals: Good  OT Frequency: Min 2X/week   Barriers to D/C:            Co-evaluation              AM-PAC OT "6 Clicks" Daily Activity     Outcome Measure Help from another person eating meals?: None Help from another person taking care of personal grooming?: A Little Help from another person toileting, which includes using toliet, bedpan, or urinal?: A Lot Help from another person bathing (including washing, rinsing, drying)?: A Lot Help from another person to put on and taking off regular upper body clothing?: A Little Help from another person to put on and taking off regular lower body clothing?: A Lot 6 Click Score: 16   End of Session Nurse Communication: Mobility status  Activity Tolerance: Patient tolerated treatment well;Patient limited by pain Patient left: in bed;with call bell/phone within reach;with bed alarm set  OT Visit Diagnosis: Other abnormalities of gait and mobility (R26.89);Pain Pain - Right/Left: Right Pain - part of body:  (side/flank)                Time: 3474-2595 OT Time Calculation (min): 29 min Charges:  OT General Charges $OT Visit: 1 Visit OT Evaluation $OT Eval Moderate Complexity: 1 Mod OT Treatments $Self Care/Home Management : 8-22 mins  Lou Cal, OT Acute Rehabilitation Services Pager [phone] Office [place] 01/22/2020,  12:24 PM
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
## 2020-01-22 NOTE — Progress Notes (Addendum)
[place]       [place] [id number]             [phone]      2 Days Post-Op Procedure(s) (LRB): XI ROBOTIC ASSISTED THORACOSCOPY PERICARDIAL WINDOW, double lumen ET tube (Right) Subjective: Upset this morning since he was unable to sleep at all last night because the lights were too bright  Objective: Vital signs in last 24 hours: Temp:  [97.6 F (36.4 C)-98.4 F (36.9 C)] 98.4 F (36.9 C) (08/18 0759) Pulse Rate:  [62-77] 72 (08/18 0759) Cardiac Rhythm: Normal sinus rhythm (08/18 0729) Resp:  [11-22] 11 (08/18 0759) BP: (106-146)/(64-78) 123/70 (08/18 0759) SpO2:  [92 %-99 %] 93 % (08/18 0759) Weight:  [65.5 kg] 65.5 kg (08/18 0431)     Intake/Output from previous day: 08/17 0701 - 08/18 0700 In: -  Out: 841 [Urine:710; Drains:90; Stool:1; Chest Tube:40] Intake/Output this shift: No intake/output data recorded.  General appearance: alert, cooperative and no distress Heart: regular rate and rhythm, S1, S2 normal, no murmur, click, rub or gallop Lungs: clear to auscultation bilaterally Abdomen: soft, non-tender; bowel sounds normal; no masses,  no organomegaly Extremities: 1+ pitting lower ext edema Wound: clean and dry  Lab Results: Recent Labs    01/21/20 0235 01/21/20 0235 01/21/20 0237 01/22/20 0428  WBC 5.4  --   --  4.7  HGB 9.8*   < > 9.5* 10.8*  HCT 29.9*   < > 28.0* 32.4*  PLT 124*  --   --  150   < > = values in this interval not displayed.   BMET:  Recent Labs    01/21/20 0235 01/21/20 0235 01/21/20 0237 01/22/20 0428  NA 133*   < > 135 135  K 4.1   < > 4.2 3.4*  CL 101  --   --  102  CO2 23  --   --  27  GLUCOSE 142*  --   --  98  BUN 19  --   --  16  CREATININE 0.85  --   --  0.84  CALCIUM 8.2*  --   --  8.1*   < > = values in this interval not displayed.    PT/INR: No results for input(s): LABPROT, INR in the last 72 hours. ABG    Component Value Date/Time   PHART 7.477 (H) 01/21/2020 0237   HCO3  27.1 01/21/2020 0237   TCO2 28 01/21/2020 0237   O2SAT 96.0 01/21/2020 0237   CBG (last 3)  Recent Labs    01/21/20 1613 01/21/20 2111 01/22/20 0647  GLUCAP 110* 120* 104*    Assessment/Plan: S/P Procedure(s) (LRB): XI ROBOTIC ASSISTED THORACOSCOPY PERICARDIAL WINDOW, double lumen ET tube (Right)  -POD-2 robotic-assisted right video thoracoscopy for drainage of suspected malignant pericardial effusion.  1. CV- BP well controlled, NSR in the 70s 2. Pulm-pericardial drain has 90cc and chest tube has only 40cc. No CXR today 3. Renal-creatinine 0.84, hypokalemia-will leave up to primary to replace 4. H and H 10.8/32.4, stable 5. Endo-blood glucose well controlled  Plan: Ordered a CXR for tomorrow morning. Chest tube with only 40cc drainage with no air leak. Will place dc chest tube ordered. Otherwise doing well. I ordered some benadryl PRN at bedtime for his insomnia.     LOS: 4 days    [place] 01/22/2020  Agree with above. We will remove Blake drain connected to bulb today We will keep pleural chest  tube we will keep pleural chest tube connected to Pleur-evac Continue pulmonary toilet Awaiting path.  [place]

## 2020-01-23 ENCOUNTER — Inpatient Hospital Stay (HOSPITAL_COMMUNITY): Payer: No Typology Code available for payment source

## 2020-01-23 DIAGNOSIS — E43 Unspecified severe protein-calorie malnutrition: Secondary | ICD-10-CM

## 2020-01-23 LAB — BASIC METABOLIC PANEL
Anion gap: 8 (ref 5–15)
BUN: 12 mg/dL (ref 8–23)
CO2: 25 mmol/L (ref 22–32)
Calcium: 8.1 mg/dL — ABNORMAL LOW (ref 8.9–10.3)
Chloride: 101 mmol/L (ref 98–111)
Creatinine, Ser: 0.86 mg/dL (ref 0.61–1.24)
GFR calc Af Amer: >60 mL/min (ref >60)
GFR calc non Af Amer: >60 mL/min (ref >60)
Glucose, Bld: 94 mg/dL (ref 70–99)
Potassium: 4.1 mmol/L (ref 3.5–5.1)
Sodium: 134 mmol/L — ABNORMAL LOW (ref 135–145)

## 2020-01-23 LAB — GLUCOSE, CAPILLARY
Glucose-Capillary: 104 mg/dL — ABNORMAL HIGH (ref 70–99)
Glucose-Capillary: 80 mg/dL (ref 70–99)
Glucose-Capillary: 83 mg/dL (ref 70–99)
Glucose-Capillary: 89 mg/dL (ref 70–99)

## 2020-01-23 NOTE — Progress Notes (Addendum)
      [address]       [place] [address]             [phone]      3 Days Post-Op Procedure(s) (LRB): XI ROBOTIC ASSISTED THORACOSCOPY PERICARDIAL WINDOW, double lumen ET tube (Right)   Subjective:  Patient complains of being tired, flat affect.  Denies pain, has moved bowels  Objective: Vital signs in last 24 hours: Temp:  [97.5 F (36.4 C)-98.4 F (36.9 C)] 97.7 F (36.5 C) (08/19 0317) Pulse Rate:  [63-81] 64 (08/19 0317) Cardiac Rhythm: Normal sinus rhythm (08/18 1900) Resp:  [11-26] 17 (08/19 0317) BP: (123-139)/(62-93) 126/75 (08/19 0317) SpO2:  [93 %-97 %] 97 % (08/19 0317)  Intake/Output from previous day: 08/18 0701 - 08/19 0700 In: -  Out: 710 [Urine:500; Drains:40; Chest Tube:170]  General appearance: alert, cooperative and no distress Heart: regular rate and rhythm Lungs: clear to auscultation bilaterally Wound: clean and dry, ecchymosis present  Lab Results: Recent Labs    01/21/20 0235 01/21/20 0235 01/21/20 0237 01/22/20 0428  WBC 5.4  --   --  4.7  HGB 9.8*   < > 9.5* 10.8*  HCT 29.9*   < > 28.0* 32.4*  PLT 124*  --   --  150   < > = values in this interval not displayed.   BMET:  Recent Labs    01/21/20 0235 01/21/20 0235 01/21/20 0237 01/22/20 0428  NA 133*   < > 135 135  K 4.1   < > 4.2 3.4*  CL 101  --   --  102  CO2 23  --   --  27  GLUCOSE 142*  --   --  98  BUN 19  --   --  16  CREATININE 0.85  --   --  0.84  CALCIUM 8.2*  --   --  8.1*   < > = values in this interval not displayed.    PT/INR: No results for input(s): LABPROT, INR in the last 72 hours. ABG    Component Value Date/Time   PHART 7.477 (H) 01/21/2020 0237   HCO3 27.1 01/21/2020 0237   TCO2 28 01/21/2020 0237   O2SAT 96.0 01/21/2020 0237   CBG (last 3)  Recent Labs    01/22/20 1612 01/22/20 2116 01/23/20 0630  GLUCAP 82 82 104*    Assessment/Plan: S/P Procedure(s) (LRB): XI ROBOTIC ASSISTED THORACOSCOPY PERICARDIAL WINDOW,  double lumen ET tube (Right)   1. CV- NSR, BP stable 2. Chest tube- 170 cc output yesterday, no air leak present, will defer chest tube management to Dr. Kipp Brood... CXR is free from pleural effusion, pneumothorax 3. Pathology/Cytology-both show metastatic carcinoma 4. Dispo- patient stable, leave chest tube in place for now, will defer removal to Dr. Kipp Brood, plan per primary   LOS: 5 days    Ellwood Handler, PA-C' 01/23/2020   Agree with above. Pericardial biopsy shows metastatic disease. We will consult oncology. We will remove chest tube once output is less than 100 mls over 24 hours.
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
## 2020-01-24 ENCOUNTER — Inpatient Hospital Stay (HOSPITAL_COMMUNITY): Payer: No Typology Code available for payment source

## 2020-01-24 LAB — GLUCOSE, CAPILLARY
Glucose-Capillary: 73 mg/dL (ref 70–99)
Glucose-Capillary: 94 mg/dL (ref 70–99)
Glucose-Capillary: 92 mg/dL (ref 70–99)
Glucose-Capillary: 90 mg/dL (ref 70–99)

## 2020-01-24 LAB — BASIC METABOLIC PANEL
Anion gap: 9 (ref 5–15)
BUN: 11 mg/dL (ref 8–23)
CO2: 21 mmol/L — ABNORMAL LOW (ref 22–32)
Calcium: 7.8 mg/dL — ABNORMAL LOW (ref 8.9–10.3)
Chloride: 99 mmol/L (ref 98–111)
Creatinine, Ser: 0.79 mg/dL (ref 0.61–1.24)
GFR calc Af Amer: >60 mL/min (ref >60)
GFR calc non Af Amer: >60 mL/min (ref >60)
Glucose, Bld: 78 mg/dL (ref 70–99)
Potassium: 3.8 mmol/L (ref 3.5–5.1)
Sodium: 129 mmol/L — ABNORMAL LOW (ref 135–145)

## 2020-01-24 LAB — CULTURE, BODY FLUID W GRAM STAIN -BOTTLE
Culture: NO GROWTH
Special Requests: ADEQUATE

## 2020-01-24 MED ORDER — RIVAROXABAN 20 MG PO TABS: 20.0000 mg | ORAL_TABLET | Freq: Every day | ORAL | Status: DC

## 2020-01-24 MED ORDER — RIVAROXABAN 15 MG PO TABS
15.0000 mg | ORAL_TABLET | Freq: Two times a day (BID) | ORAL | Status: DC
  Administered 2020-01-24 – 2020-01-25 (×3): 15 mg via ORAL
  Filled 2020-01-24 (×3): qty 1

## 2020-01-24 NOTE — Plan of Care (Signed)
  Problem: Education: Goal: Knowledge of General Education information will improve Description Including pain rating scale, medication(s)/side effects and non-pharmacologic comfort measures Outcome: Progressing   

## 2020-01-24 NOTE — Discharge Instructions (Addendum)
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
## 2020-01-24 NOTE — Progress Notes (Signed)
Cardiology: Chart reviewed. All issues addressed by [facility] and cardiac surgical teams. Plans noted for discharge with follow-up in the [place] system. Let us know if any cardiac issues arise, otherwise will sign off.  Sherren Mocha 01/24/2020 11:35 AM

## 2020-01-24 NOTE — Progress Notes (Addendum)
      [address]       [place] [address]             [phone]      4 Days Post-Op Procedure(s) (LRB): XI ROBOTIC ASSISTED THORACOSCOPY PERICARDIAL WINDOW, double lumen ET tube (Right)   Subjective:  Complaints of being tired, flat affect  Objective: Vital signs in last 24 hours: Temp:  [97.5 F (36.4 C)-98.3 F (36.8 C)] 98.3 F (36.8 C) (08/20 0437) Pulse Rate:  [61-81] 81 (08/20 0437) Cardiac Rhythm: Sinus bradycardia (08/19 1900) Resp:  [17-24] 17 (08/20 0437) BP: (151-161)/(67-91) 158/84 (08/20 0437) SpO2:  [94 %-98 %] 97 % (08/20 0437)  Intake/Output from previous day: 08/19 0701 - 08/20 0700 In: -  Out: 1200 [Urine:1100; Chest Tube:100]  General appearance: alert, cooperative and no distress Heart: regular rate and rhythm Lungs: clear to auscultation bilaterally Abdomen: soft, non-tender; bowel sounds normal; no masses,  no organomegaly Extremities: extremities normal, atraumatic, no cyanosis or edema Wound: clean and dry  Lab Results: Recent Labs    01/22/20 0428  WBC 4.7  HGB 10.8*  HCT 32.4*  PLT 150   BMET:  Recent Labs    01/23/20 0950 01/24/20 0237  NA 134* 129*  K 4.1 3.8  CL 101 99  CO2 25 21*  GLUCOSE 94 78  BUN 12 11  CREATININE 0.86 0.79  CALCIUM 8.1* 7.8*    PT/INR: No results for input(s): LABPROT, INR in the last 72 hours. ABG    Component Value Date/Time   PHART 7.477 (H) 01/21/2020 0237   HCO3 27.1 01/21/2020 0237   TCO2 28 01/21/2020 0237   O2SAT 96.0 01/21/2020 0237   CBG (last 3)  Recent Labs    01/23/20 1558 01/23/20 2121 01/24/20 0612  GLUCAP 83 89 73    Assessment/Plan: S/P Procedure(s) (LRB): XI ROBOTIC ASSISTED THORACOSCOPY PERICARDIAL WINDOW, double lumen ET tube (Right)  1. Chest tube- 100 cc output yesterday, will d/c today, get CXR in a few hours if remains stable, will be okay to d/c home from our standpoint 2. Metastatic lung cancer- will need Oncology follow-up.. I reached  out to [facility] who states as patient is [facility] would require referal and prior authorization  3. Dispo- patient stable, d/c chest tube today, repeat CXR if stable can d/c home later today from surgery standpoint, care per primary   LOS: 6 days    Ellwood Handler, PA-C  01/24/2020   Addendum:  CXR is free from pneumothorax, okay to d/c from cardiac surgery standpoint  Erin Barrett, PA-C Agree with above CT out Please call with questions  Lajuana Matte
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
## 2020-01-24 NOTE — Progress Notes (Signed)
Chest tube removed per MD order. Patient tolerated activity well and is resting in bed eating breakfast. VSS, 2 sats 97-100% on RA, HR 78. Patient given pain meds prior to CT removal. Will continue to monitor.   Glen Scott M

## 2020-01-25 DIAGNOSIS — R071 Chest pain on breathing: Secondary | ICD-10-CM

## 2020-01-25 LAB — CBC WITH DIFFERENTIAL/PLATELET
Abs Immature Granulocytes: 0.03 10*3/uL (ref 0.00–0.07)
Basophils Absolute: 0 10*3/uL (ref 0.0–0.1)
Basophils Relative: 1 %
Eosinophils Absolute: 0.4 10*3/uL (ref 0.0–0.5)
Eosinophils Relative: 8 %
HCT: 36.8 % — ABNORMAL LOW (ref 39.0–52.0)
Hemoglobin: 12.2 g/dL — ABNORMAL LOW (ref 13.0–17.0)
Immature Granulocytes: 1 %
Lymphocytes Relative: 15 %
Lymphs Abs: 0.8 10*3/uL (ref 0.7–4.0)
MCH: 29.1 pg (ref 26.0–34.0)
MCHC: 33.2 g/dL (ref 30.0–36.0)
MCV: 87.8 fL (ref 80.0–100.0)
Monocytes Absolute: 0.4 10*3/uL (ref 0.1–1.0)
Monocytes Relative: 7 %
Neutro Abs: 3.8 10*3/uL (ref 1.7–7.7)
Neutrophils Relative %: 68 %
Platelets: 177 10*3/uL (ref 150–400)
RBC: 4.19 MIL/uL — ABNORMAL LOW (ref 4.22–5.81)
RDW: 13.5 % (ref 11.5–15.5)
WBC: 5.5 10*3/uL (ref 4.0–10.5)
nRBC: 0 % (ref 0.0–0.2)

## 2020-01-25 LAB — GLUCOSE, CAPILLARY
Glucose-Capillary: 104 mg/dL — ABNORMAL HIGH (ref 70–99)
Glucose-Capillary: 84 mg/dL (ref 70–99)

## 2020-01-25 LAB — MAGNESIUM: Magnesium: 1.9 mg/dL (ref 1.7–2.4)

## 2020-01-25 LAB — BASIC METABOLIC PANEL
Anion gap: 11 (ref 5–15)
BUN: 11 mg/dL (ref 8–23)
CO2: 25 mmol/L (ref 22–32)
Calcium: 8.6 mg/dL — ABNORMAL LOW (ref 8.9–10.3)
Chloride: 98 mmol/L (ref 98–111)
Creatinine, Ser: 0.87 mg/dL (ref 0.61–1.24)
GFR calc Af Amer: >60 mL/min (ref >60)
GFR calc non Af Amer: >60 mL/min (ref >60)
Glucose, Bld: 95 mg/dL (ref 70–99)
Potassium: 4 mmol/L (ref 3.5–5.1)
Sodium: 134 mmol/L — ABNORMAL LOW (ref 135–145)

## 2020-01-25 LAB — PHOSPHORUS: Phosphorus: 3.1 mg/dL (ref 2.5–4.6)

## 2020-01-25 MED ORDER — RIVAROXABAN 20 MG PO TABS: 20.0000 mg | ORAL_TABLET | Freq: Every day | ORAL | 0 refills | Status: AC

## 2020-01-25 MED ORDER — ONDANSETRON HCL 4 MG PO TABS: 4.0000 mg | ORAL_TABLET | Freq: Three times a day (TID) | ORAL | 0 refills | Status: AC | PRN

## 2020-01-25 MED ORDER — TRAMADOL HCL 50 MG PO TABS
50.0000 mg | ORAL_TABLET | Freq: Four times a day (QID) | ORAL | 0 refills | Status: AC | PRN
Start: 2020-01-25 — End: ?

## 2020-01-25 NOTE — Discharge Summary (Signed)
Physician Discharge Summary  Glen Scott NTZ:[id number] DOB: 03-Oct-1945 DOA: 01/18/2020  PCP: Leisa Lenz, MD  Admit date: 01/18/2020 Discharge date: 01/25/2020  Time spent: 30 minutes  Recommendations for Outpatient Follow-up:  Covid vaccination; no vaccination  Malignant pericardial effusion with cardiac tamponade: Status post robotic assisted right video thoracoscopy, with drain placement Cytologies pending. Surgery was discontinued chest tube added as it had less than 100 cc, will repeat a chest x-ray and can probably be discharged within 24 hours. Will need to follow-up at the [place]. -Spoke at length with patient states has had CT chest/abdomen/pelvis performed at [place] in [place].  Currently does not have an oncologist but prefers to have one in [place]; closer to where he lives. -Per cardiothoracic surgery note 8/20 Dr. Kipp Brood patient cleared for discharge.  Dr. Kipp Brood reached out to [facility] who states as patient is [facility] would require referal and prior authorization  -Per Dr. Sherren Mocha cardiology patient clear for discharge.  Patient is to follow-up in the [place] system  Metastatic adenocarcinoma lung -See malignant pericardial effusion   Bilateral PE: -Resume Xarelto on 01/24/2020,CT surgery to recommend to oral anticoagulants.  Abdominal aortic aneurysm with thrombus: -Per PCCM note 8/14 Dr. Jorje Guild discussed AAA with vascular on-call no emergent intervention needed overnight -resume anti-coagulation as able -follow up with out patient vascular surgeon.   -Unable to see exact size of AAA on study in epic, however Dr. Fransico Him cardiology note from 8/14 identifies the AAA as being 4.3 cm with increased growth in a few weeks to 4.4 cm with new finding of large saccular thrombus encompassing 60% of the lumen. -Patient will need to follow-up in the [place] system.  Hypokalemia: -Resolved  Mild normocytic anemia: -Anemia improving   Severe protein calorie  malnutrition -Most likely secondary to Crohn's disease    Discharge Diagnoses:  Principal Problem:   Pericardial effusion with cardiac tamponade Active Problems:   Bilateral pulmonary embolism (HCC)   Pleural effusion, left   AAA (abdominal aortic aneurysm) without rupture (HCC)   Protein-calorie malnutrition, severe   Discharge Condition: Stable  Diet recommendation: Regular  Filed Weights   01/21/20 0244 01/22/20 0431 01/25/20 0440  Weight: 66.9 kg 65.5 kg 60.7 kg    History of present illness:  74 y.o.WM PMHx AAA, bilateral pleural effusion, tobacco abuse, Crohn's disease, pericardial effusion, umbilical hernia.  Rrecently diagnosed malignant cells on thoracentesis at the [facility] on 01/08/20, Colitis, bilateral PE on xarelto, AAA who developed dyspnea, abdominal pain, and n/v this morning, so presented to [facility] where CT abd/pelvis showed worsening thrombus in known AAA with paricardial effusion and possible tamponade. Accepted in transfer to the ED where PCCM was consulted    Hospital Course:  See above  Procedures: 8/16 XI ROBOTIC ASSISTED THORACOSCOPY PERICARDIAL WINDOW, double lumen ET tube (Right)-intercostal nerve block  Consultations: Cardiothoracic surgery Dr. Kipp Brood Cardiology Dr. Sherren Mocha   Cultures   8/14 SARS coronavirus negative 8/15 MRSA by PCR negative 8/15 LEFT pericardial fluid negative final  8/16 nasal swab positive MRSA   Antibiotics Anti-infectives (From admission, onward)   Start     Ordered Stop   01/20/20 2300  vancomycin (VANCOCIN) IVPB 1000 mg/200 mL premix        01/20/20 1604 01/20/20 2315     Discharge Exam: Vitals:   01/25/20 0000 01/25/20 0400 01/25/20 0440 01/25/20 0805  BP: 129/77 (!) 143/84  (!) 147/83  Pulse: 89 75 83 66  Resp: 17 15 (!) 21 18  Temp: 97.7 F (36.5 C) 98.6 F (37 C)  98.5 F (36.9 C)  TempSrc: Oral Oral  Oral  SpO2: 96% 98% 97% 97%  Weight:   60.7 kg   Height:        Physical Exam:  General: No acute respiratory distress Eyes: negative scleral hemorrhage, negative anisocoria, negative icterus ENT: Negative Runny nose, negative gingival bleeding, Neck:  Negative scars, masses, torticollis, lymphadenopathy, JVD Lungs: right-sided crackles  cardiovascular: Regular rate and rhythm without murmur gallop or rub normal S1 and S2 Abdomen: negative abdominal pain, nondistended, positive soft, bowel sounds, no rebound, no ascites, no appreciable mass  Discharge Instructions   Allergies as of 01/25/2020      Reactions   Morphine And Related    "I bleed out from my pores"   Shellfish Allergy Anaphylaxis   Allergic to ALL seafood   Lemon Flavor    Allergic to lemons   Mushroom Extract Complex    GI upset   Penicillins       Medication List    TAKE these medications   colchicine 0.6 MG tablet Take 0.6 mg by mouth 2 (two) times daily.   ipratropium-albuterol 0.5-2.5 (3) MG/3ML Soln Commonly known as: DUONEB Take 3 mLs by nebulization in the morning and at bedtime.   ondansetron 4 MG tablet Commonly known as: Zofran Take 1 tablet (4 mg total) by mouth every 8 (eight) hours as needed for nausea or vomiting.   rivaroxaban 20 MG Tabs tablet Commonly known as: XARELTO Take 1 tablet (20 mg total) by mouth daily. Start taking on: January 29, 2020 What changed:   medication strength  how much to take  when to take this  additional instructions  These instructions start on January 29, 2020. If you are unsure what to do until then, ask your doctor or other care provider.   traMADol 50 MG tablet Commonly known as: ULTRAM Take 1 tablet (50 mg total) by mouth every 6 (six) hours as needed (mild pain).      Allergies  Allergen Reactions  . Morphine And Related     "I bleed out from my pores"  . Shellfish Allergy Anaphylaxis    Allergic to ALL seafood  . Lemon Flavor     Allergic to lemons  . Mushroom Extract Complex     GI upset  .  Penicillins     Follow-up Information    Leisa Lenz, MD Follow up.   Specialty: Family Medicine Contact information: [place] [address] [phone]        Sueanne Margarita, MD .   Specialty: Cardiology Contact information: [phone] [address] [place] [address] [phone]        Lajuana Matte, MD Follow up.   Specialty: Cardiothoracic Surgery Why: Your follow-up appointment is on 02/07/2020 at 11:45am. Contact information: [place] [address] [phone]                The results of significant diagnostics from this hospitalization (including imaging, microbiology, ancillary and laboratory) are listed below for reference.    Significant Diagnostic Studies: DG Chest 2 View  Result Date: 01/24/2020 CLINICAL DATA:  Status post chest tube removal EXAM: CHEST - 2 VIEW COMPARISON:  January 23, 2020 FINDINGS: Chest tube has been removed on the right. There is a small amount of subcutaneous air on the right inferolaterally, but no pneumothorax evident. There is a small left pleural effusion with  mild left base atelectasis. No edema or airspace opacity. An irregular opacity in the right upper lobe measuring 1.1 x 1.1 cm remains. Heart size and pulmonary vascular normal. No adenopathy. There is aortic atherosclerosis. No bone lesions. IMPRESSION: 1.  No pneumothorax following removal of chest tube. 2. Irregular opacity in the right upper lobe measuring 1.1 x 1.1 cm. Advise noncontrast enhanced chest CT to further evaluate. 3.  Small left pleural effusion with left base atelectasis. 4.  No edema or airspace opacity. 5.  Heart size normal.  Aortic Atherosclerosis (ICD10-I70.0). Electronically Signed   By: Lowella Grip III M.D.   On: 01/24/2020 10:41   CARDIAC CATHETERIZATION  Result Date: 01/19/2020 Large pericardial effusion with 1080 mL bloody fluid drained via subxiphoid approach.Large pericardial effusion  with 1080 mL bloody fluid drained via subxiphoid approach. [name], MD   DG Chest Port 1 View  Result Date: 01/23/2020 CLINICAL DATA:  Pericardial effusion.  Chest tube. EXAM: PORTABLE CHEST 1 VIEW COMPARISON:  01/21/2020. FINDINGS: Interim removal of pericardial drainage catheter. Right chest tube in stable position. Heart size normal. Persistent left base atelectasis/infiltrate and small left pleural effusion. No interim change. Small density previously noted over the right upper lung is again noted and partially obscured by overlying chest tube. Again nonenhanced chest CT suggested for further evaluation. No pneumothorax. Radiopaque density noted over the lower neck may be outside of the patient, this was not present on prior study. No acute bony abnormality. IMPRESSION: 1. Interim removal of pericardial drainage catheter. Right chest tube in stable position. No pneumothorax. Heart size normal. 2. Persistent left base atelectasis/infiltrate small left pleural effusion. No unchanged. 3. Small density previously noted over the right upper lung is again noted and partially obscured by overlying chest tube. Again nonenhanced chest CT suggested for further evaluation. Electronically Signed   By: Marcello Moores  Register   On: 01/23/2020 06:19   DG Chest Port 1 View  Result Date: 01/21/2020 CLINICAL DATA:  Chest tube.  Pericardial effusion.  Sore chest. EXAM: PORTABLE CHEST 1 VIEW COMPARISON:  01/20/2020.  01/18/2020. FINDINGS: Right chest tube and pericardial drainage catheter in stable position. Cardiomegaly. No pulmonary venous congestion. Small ill-defined density noted over the right upper lung. A small pulmonary mass cannot be excluded. Nonenhanced chest CT suggested to further evaluate. Persistent left base infiltrate and atelectasis. Small left pleural effusion again noted. No pneumothorax. Aortic atherosclerotic vascular calcification. Degenerative change thoracic spine. Stable mild right chest wall  subcutaneous emphysema. Surgical clips right upper quadrant. IMPRESSION: 1. Right chest tube and pericardial drainage catheter in stable position. No pneumothorax. 2.  Stable cardiomegaly.  No pulmonary venous congestion. 3. Persistent left base atelectasis and infiltrate. Persistent small left pleural effusion. No interim change. 4. Small ill defined density noted over the right upper lung. A small pulmonary mass cannot be excluded. Nonenhanced chest CT suggested for further evaluation. These results will be called to the ordering clinician or representative by the Radiologist Assistant, and communication documented in the PACS or [employer]. Electronically Signed   By: Marcello Moores  Register   On: 01/21/2020 06:46   DG Chest Port 1 View  Result Date: 01/20/2020 CLINICAL DATA:  Pericardial effusion. EXAM: PORTABLE CHEST 1 VIEW COMPARISON:  One-view chest x-ray 01/18/2020 FINDINGS: Is straightening the left heart shadow. Mild pulmonary vascular congestion present. Left pleural effusion is noted. Left basilar airspace disease present. Atherosclerotic changes are noted at the aortic arch. IMPRESSION: 1. Mild pulmonary vascular congestion and left pleural effusion. 2. Left basilar  airspace disease likely reflects atelectasis. Electronically Signed   By: San Morelle M.D.   On: 01/20/2020 15:43   DG Chest Port 1 View  Result Date: 01/18/2020 CLINICAL DATA:  Chest pain and shortness of breath. EXAM: PORTABLE CHEST 1 VIEW COMPARISON:  None. FINDINGS: Mild atelectasis and/or infiltrate is seen within the retrocardiac region of the left lung base. There is a small left pleural effusion. No pneumothorax is identified. The cardiac silhouette is markedly enlarged. There is mild calcification of the aortic arch. The visualized skeletal structures are unremarkable. IMPRESSION: 1. Mild left basilar atelectasis and/or infiltrate. 2. Small left pleural effusion. Electronically Signed   By: Virgina Norfolk M.D.    On: 01/18/2020 22:24   ECHOCARDIOGRAM COMPLETE  Result Date: 01/18/2020    ECHOCARDIOGRAM REPORT   Patient Name:   Glen Scott Date of Exam: 01/18/2020 Medical Rec #:  [id number]  Height:       68.0 in Accession #:    [id number] Weight:       145.0 lb Date of Birth:  06/07/1945  BSA:          1.783 m Patient Age:    25 years   BP:           103/80 mmHg Patient Gender: M          HR:           107 bpm. Exam Location:  Inpatient Procedure: 2D Echo, 3D Echo, Color Doppler and Cardiac Doppler STAT ECHO Indications:    I31.3 Pericardial effusion (noninflammatory)  History:        Patient has no prior history of Echocardiogram examinations.                 Risk Factors:Current Smoker.  Sonographer:    Raquel Sarna Senior RDCS Referring Phys: [place]  Sonographer Comments: Respirometer not tracking well, labels made by sonographer observation. IMPRESSIONS  1. Left ventricular ejection fraction, by estimation, is 65 to 70%. The left ventricle has normal function. The left ventricle has no regional wall motion abnormalities. Left ventricular diastolic function could not be evaluated.  2. Right ventricular systolic function is normal. The right ventricular size is normal. There is normal pulmonary artery systolic pressure. The estimated right ventricular systolic pressure is 72.5 mmHg.  3. The mitral valve is normal in structure. No evidence of mitral valve regurgitation. No evidence of mitral stenosis.  4. The aortic valve is normal in structure. Aortic valve regurgitation is not visualized. No aortic stenosis is present.  5. The inferior vena cava is dilated in size with <50% respiratory variability, suggesting right atrial pressure of 15 mmHg.  6. Larger circumferential pericardial effusion measuring 2.79 x 3.33cm at greatest diameter. There is RA inversion. Possible subtle RV diastolic collapse. The RV cavity is small. Borderline respirophasic changes in MV inflow velocities with a 57mmHg difference. The IVC is  dilated with < 50% respirophasic change. Findings worrisome for cardiac tamponade. . Large pericardial effusion. The pericardial effusion is circumferential. Moderate pleural effusion in the left lateral region. FINDINGS  Left Ventricle: Left ventricular ejection fraction, by estimation, is 65 to 70%. The left ventricle has normal function. The left ventricle has no regional wall motion abnormalities. The left ventricular internal cavity size was normal in size. There is  no left ventricular hypertrophy. Left ventricular diastolic function could not be evaluated. Right Ventricle: The right ventricular size is normal. No increase in right ventricular wall thickness. Right ventricular systolic function is normal. There  is normal pulmonary artery systolic pressure. The tricuspid regurgitant velocity is 2.05 m/s, and  with an assumed right atrial pressure of 15 mmHg, the estimated right ventricular systolic pressure is 16.0 mmHg. Left Atrium: Left atrial size was normal in size. Right Atrium: Right atrial size was normal in size. Pericardium: Larger circumferential pericardial effusion measuring 2.79 x 3.33cm at greatest diameter. There is RA inversion. Possible subtle RV diastolic collapse. The RV cavity is small. Borderline respirophasic changes in MV inflow velocities with a 32mmHg difference. The IVC is dilated with < 50% respirophasic change. Findings worrisome for cardiac tamponade. A large pericardial effusion is present. The pericardial effusion is circumferential. Mitral Valve: The mitral valve is normal in structure. Normal mobility of the mitral valve leaflets. No evidence of mitral valve regurgitation. No evidence of mitral valve stenosis. Tricuspid Valve: The tricuspid valve is normal in structure. Tricuspid valve regurgitation is trivial. No evidence of tricuspid stenosis. Aortic Valve: The aortic valve is normal in structure. Aortic valve regurgitation is not visualized. No aortic stenosis is present.  Pulmonic Valve: The pulmonic valve was normal in structure. Pulmonic valve regurgitation is not visualized. No evidence of pulmonic stenosis. Aorta: The aortic root is normal in size and structure. Venous: The inferior vena cava is dilated in size with less than 50% respiratory variability, suggesting right atrial pressure of 15 mmHg. IAS/Shunts: No atrial level shunt detected by color flow Doppler. Additional Comments: There is a moderate pleural effusion in the left lateral region.  RIGHT VENTRICLE RV S prime:     12.10 cm/s TAPSE (M-mode): 1.8 cm TRICUSPID VALVE TR Peak grad:   16.8 mmHg TR Vmax:        205.00 cm/s Fransico Him MD Electronically signed by Fransico Him MD Signature Date/Time: 01/18/2020/11:07:22 PM    Final    ECHOCARDIOGRAM LIMITED  Result Date: 01/19/2020    ECHOCARDIOGRAM LIMITED REPORT   Patient Name:   Glen Scott Date of Exam: 01/19/2020 Medical Rec #:  [id number]  Height:       68.0 in Accession #:    [id number] Weight:       149.3 lb Date of Birth:  01/30/1946  BSA:          1.805 m Patient Age:    74 years   BP:           148/79 mmHg Patient Gender: M          HR:           81 bpm. Exam Location:  Inpatient Procedure: Limited Echo Indications:    Pericardial effusion 423.9 / I31.3  History:        Patient has prior history of Echocardiogram examinations, most                 recent 01/18/2020. AAA. Pericardial effusion.  Sonographer:    Vickie Epley RDCS Referring Phys: [place]  1. Left ventricular ejection fraction, by estimation, is 65 to 70%. The left ventricle has normal function. The left ventricle has no regional wall motion abnormalities.  2. Right ventricular systolic function is normal. The right ventricular size is normal.  3. Pericardial effusion s/p drainage, now very small circumferential fluid remaining. Moderate pleural effusion in both left and right lateral regions.  4. The inferior vena cava is normal in size with greater than 50% respiratory  variability, suggesting right atrial pressure of 3 mmHg. Conclusion(s)/Recommendation(s): S/P pericardiocentesis, with only very small circumferential effusion remaining. No evidence of  tamponade, IVC normal and collapses, no apparent RA/RV collapse. FINDINGS  Left Ventricle: Left ventricular ejection fraction, by estimation, is 65 to 70%. The left ventricle has normal function. The left ventricle has no regional wall motion abnormalities. The left ventricular internal cavity size was normal in size. Right Ventricle: The right ventricular size is normal. No increase in right ventricular wall thickness. Right ventricular systolic function is normal. Pericardium: Pericardial effusion s/p drainage, now very small circumferential fluid remaining. A small pericardial effusion is present. Venous: The inferior vena cava is normal in size with greater than 50% respiratory variability, suggesting right atrial pressure of 3 mmHg. Additional Comments: There is a moderate pleural effusion in both left and right lateral regions. Buford Dresser MD Electronically signed by Buford Dresser MD Signature Date/Time: 01/19/2020/3:37:49 PM    Final     Microbiology: Recent Results (from the past 240 hour(s))  SARS Coronavirus 2 by RT PCR (hospital order, performed in [facility] hospital lab) Nasopharyngeal Nasopharyngeal Swab     Status: None   Collection Time: 01/18/20 10:49 PM   Specimen: Nasopharyngeal Swab  Result Value Ref Range Status   SARS Coronavirus 2 NEGATIVE NEGATIVE Final    Comment: (NOTE) SARS-CoV-2 target nucleic acids are NOT DETECTED.  The SARS-CoV-2 RNA is generally detectable in upper and lower respiratory specimens during the acute phase of infection. The lowest concentration of SARS-CoV-2 viral copies this assay can detect is 250 copies / mL. A negative result does not preclude SARS-CoV-2 infection and should not be used as the sole basis for treatment or other patient management  decisions.  A negative result may occur with improper specimen collection / handling, submission of specimen other than nasopharyngeal swab, presence of viral mutation(s) within the areas targeted by this assay, and inadequate number of viral copies (<250 copies / mL). A negative result must be combined with clinical observations, patient history, and epidemiological information.  Fact Sheet for Patients:   StrictlyIdeas.no  Fact Sheet for Healthcare Providers: BankingDealers.co.za  This test is not yet approved or  cleared by the [place] FDA and has been authorized for detection and/or diagnosis of SARS-CoV-2 by FDA under an Emergency Use Authorization (EUA).  This EUA will remain in effect (meaning this test can be used) for the duration of the COVID-19 declaration under Section 564(b)(1) of the Act, 21 U.S.C. section 360bbb-3(b)(1), unless the authorization is terminated or revoked sooner.  Performed at [facility], [place] [address], [place], [place] [address]   MRSA PCR Screening     Status: None   Collection Time: 01/19/20  1:46 AM   Specimen: Nasal Mucosa; Nasopharyngeal  Result Value Ref Range Status   MRSA by PCR NEGATIVE NEGATIVE Final    Comment:        The GeneXpert MRSA Assay (FDA approved for NASAL specimens only), is one component of a comprehensive MRSA colonization surveillance program. It is not intended to diagnose MRSA infection nor to guide or monitor treatment for MRSA infections. Performed at [facility], [place] [address] [place], [place] [address]   Culture, body fluid-bottle     Status: None   Collection Time: 01/19/20  5:09 AM   Specimen: Fluid  Result Value Ref Range Status   Specimen Description FLUID PERICARDIUM  Final   Special Requests   Final    BOTTLES DRAWN AEROBIC AND ANAEROBIC Blood Culture adequate volume   Culture   Final    NO GROWTH 5 DAYS Performed at [facility], [place] [address], [place], [place] [address]    Report Status 01/24/2020 FINAL  Final  Gram stain     Status: None   Collection Time: 01/19/20  5:09 AM   Specimen: Fluid  Result Value Ref Range Status   Specimen Description FLUID PERICARDIUM  Final   Special Requests NONE  Final   Gram Stain   Final    MODERATE WBC PRESENT,BOTH PMN AND MONONUCLEAR NO ORGANISMS SEEN Performed at [facility], [place] [address] [place], [place] [address]    Report Status 01/19/2020 FINAL  Final  Surgical pcr screen     Status: Abnormal   Collection Time: 01/20/20 11:49 AM   Specimen: Nasal Mucosa; Nasal Swab  Result Value Ref Range Status   MRSA, PCR NEGATIVE NEGATIVE Final   Staphylococcus aureus POSITIVE (A) NEGATIVE Final    Comment: (NOTE) The Xpert SA Assay (FDA approved for NASAL specimens in patients 42 years of age and older), is one component of a comprehensive surveillance program. It is not intended to diagnose infection nor to guide or monitor treatment. Performed at [facility], [place] [address] [place], [place] [address]      Labs: Basic Metabolic Panel: Recent Labs  Lab 01/21/20 0235 01/21/20 0235 01/21/20 0237 01/22/20 0428 01/23/20 0950 01/24/20 0237 01/25/20 0138 01/25/20 1010  NA 133*   < > 135 135 134* 129* 134*  --   K 4.1   < > 4.2 3.4* 4.1 3.8 4.0  --   CL 101  --   --  102 101 99 98  --   CO2 23  --   --  27 25 21* 25  --   GLUCOSE 142*  --   --  98 94 78 95  --   BUN 19  --   --  16 12 11 11   --   CREATININE 0.85  --   --  0.84 0.86 0.79 0.87  --   CALCIUM 8.2*  --   --  8.1* 8.1* 7.8* 8.6*  --   MG  --   --   --   --   --   --   --  1.9  PHOS  --   --   --   --   --   --   --  3.1   < > = values in this interval not displayed.   Liver Function Tests: Recent Labs  Lab 01/18/20 2250 01/21/20 0235 01/22/20 0428  AST 115* 39 31  ALT 115* 100* 81*  ALKPHOS 73 73 73  BILITOT 1.0 0.9 0.6  PROT 5.5* 5.1* 4.9*  ALBUMIN 2.8* 2.4* 2.3*    No results for input(s): LIPASE, AMYLASE in the last 168 hours. No results for input(s): AMMONIA in the last 168 hours. CBC: Recent Labs  Lab 01/18/20 2250 01/18/20 2250 01/20/20 0652 01/21/20 0235 01/21/20 0237 01/22/20 0428 01/25/20 1010  WBC 16.9*  --  6.8 5.4  --  4.7 5.5  NEUTROABS  --   --   --   --   --   --  3.8  HGB 11.1*   < > 10.1* 9.8* 9.5* 10.8* 12.2*  HCT 37.1*   < > 31.3* 29.9* 28.0* 32.4* 36.8*  MCV 97.1  --  90.7 87.9  --  87.8 87.8  PLT 251  --  112* 124*  --  150 177   < > = values in this interval not  displayed.   Cardiac Enzymes: No results for input(s): CKTOTAL, CKMB, CKMBINDEX, TROPONINI in the last 168 hours. BNP: BNP (last 3 results) No results for input(s): BNP in the last 8760 hours.  ProBNP (last 3 results) No results for input(s): PROBNP in the last 8760 hours.  CBG: Recent Labs  Lab 01/24/20 0612 01/24/20 1130 01/24/20 1606 01/24/20 2109 01/25/20 0627  GLUCAP [phone]*       Signed:  Dia Crawford, MD [facility] [phone] pager

## 2020-01-25 NOTE — Progress Notes (Signed)
Discharged to home with family office visits in place teaching done  

## 2020-01-27 ENCOUNTER — Encounter: Payer: Self-pay | Admitting: *Deleted

## 2020-01-27 ENCOUNTER — Telehealth: Payer: Self-pay | Admitting: *Deleted

## 2020-01-27 DIAGNOSIS — R918 Other nonspecific abnormal finding of lung field: Secondary | ICD-10-CM

## 2020-01-27 NOTE — Telephone Encounter (Signed)
Received a staff msg from the thoracic navigator to schedule a new pt appt. Glen Scott has been cld and scheduled to see Dr. Julien Nordmann on 8/31 at 1:30pm w/labs at 1pm. Pt aware to arrive 15 minutes early. Letter mailed.

## 2020-01-27 NOTE — Progress Notes (Signed)
I received notification that Mr. Glen Scott from [place] has been approved. I notified new patient scheduler to call and schedule him to be seen with Dr. Julien Nordmann next week.

## 2020-01-27 NOTE — Progress Notes (Signed)
I received a message that patient has been referred to med onc.  This is a [facility] patient. I contacted the referring office today to see if referral from [place] has been authed.

## 2020-02-04 ENCOUNTER — Inpatient Hospital Stay: Payer: No Typology Code available for payment source

## 2020-02-04 ENCOUNTER — Inpatient Hospital Stay: Payer: No Typology Code available for payment source | Admitting: Internal Medicine

## 2020-02-04 ENCOUNTER — Telehealth: Payer: Self-pay | Admitting: Internal Medicine

## 2020-02-04 NOTE — Telephone Encounter (Signed)
Pt's daughter cld to reschedule his appt to 9/13 at 215pm w/labs at 145pm due to the pt being in the hospital.

## 2020-02-06 ENCOUNTER — Telehealth: Payer: Self-pay | Admitting: *Deleted

## 2020-02-06 NOTE — Telephone Encounter (Signed)
placed call to daughter-pt in [facility], pt needs to call [place] office Lelan Pons [phone] to give inf for [facility] auth.

## 2020-02-07 ENCOUNTER — Ambulatory Visit: Payer: Medicare PPO | Admitting: Thoracic Surgery (Cardiothoracic Vascular Surgery)

## 2020-02-11 ENCOUNTER — Encounter: Payer: Self-pay | Admitting: *Deleted

## 2020-02-11 ENCOUNTER — Telehealth: Payer: Self-pay | Admitting: *Deleted

## 2020-02-11 NOTE — Telephone Encounter (Signed)
I called patient to update him that his appt with Dr. Julien Nordmann is cancelled due to [facility]  He verbalized understanding and was thankful for the call.

## 2020-02-11 NOTE — Progress Notes (Signed)
I received a call from the [place].  Patient is being seen by medical oncology today and they are asking about molecular testing and PDL 1 was sent.  These test were not sent.  I called [facility] and updated this [facility].  They are seeing patient today.  I will also update referring office that patient is being seen at the [facility] and no need to be seen here.  I will cancel his appt here.

## 2020-02-12 ENCOUNTER — Other Ambulatory Visit: Payer: Self-pay | Admitting: Thoracic Surgery (Cardiothoracic Vascular Surgery)

## 2020-02-12 DIAGNOSIS — Z9889 Other specified postprocedural states: Secondary | ICD-10-CM

## 2020-02-12 DIAGNOSIS — J90 Pleural effusion, not elsewhere classified: Secondary | ICD-10-CM

## 2020-02-14 ENCOUNTER — Ambulatory Visit: Payer: Self-pay | Admitting: Thoracic Surgery (Cardiothoracic Vascular Surgery)

## 2020-02-17 ENCOUNTER — Inpatient Hospital Stay: Payer: No Typology Code available for payment source | Admitting: Internal Medicine

## 2020-02-17 ENCOUNTER — Inpatient Hospital Stay: Payer: No Typology Code available for payment source

## 2020-02-21 ENCOUNTER — Ambulatory Visit: Payer: No Typology Code available for payment source | Admitting: Thoracic Surgery (Cardiothoracic Vascular Surgery)

## 2020-02-21 ENCOUNTER — Ambulatory Visit: Payer: Self-pay | Admitting: Thoracic Surgery (Cardiothoracic Vascular Surgery)

## 2020-02-24 ENCOUNTER — Encounter (HOSPITAL_COMMUNITY): Payer: Self-pay

## 2020-02-26 ENCOUNTER — Encounter (HOSPITAL_COMMUNITY): Payer: Self-pay | Admitting: Anatomic Pathology & Clinical Pathology

## 2020-02-26 ENCOUNTER — Encounter (HOSPITAL_COMMUNITY): Payer: Self-pay | Admitting: Neonatology

## 2020-02-26 LAB — CYTOLOGY - NON PAP

## 2022-03-28 IMAGING — DX DG CHEST 1V PORT
1 series · 1 of 1 positions shown · non-contrast
Comparison: 01/21/2020.

CLINICAL DATA: Pericardial effusion.  Chest tube.

EXAM:
PORTABLE CHEST 1 VIEW

[chest ap]
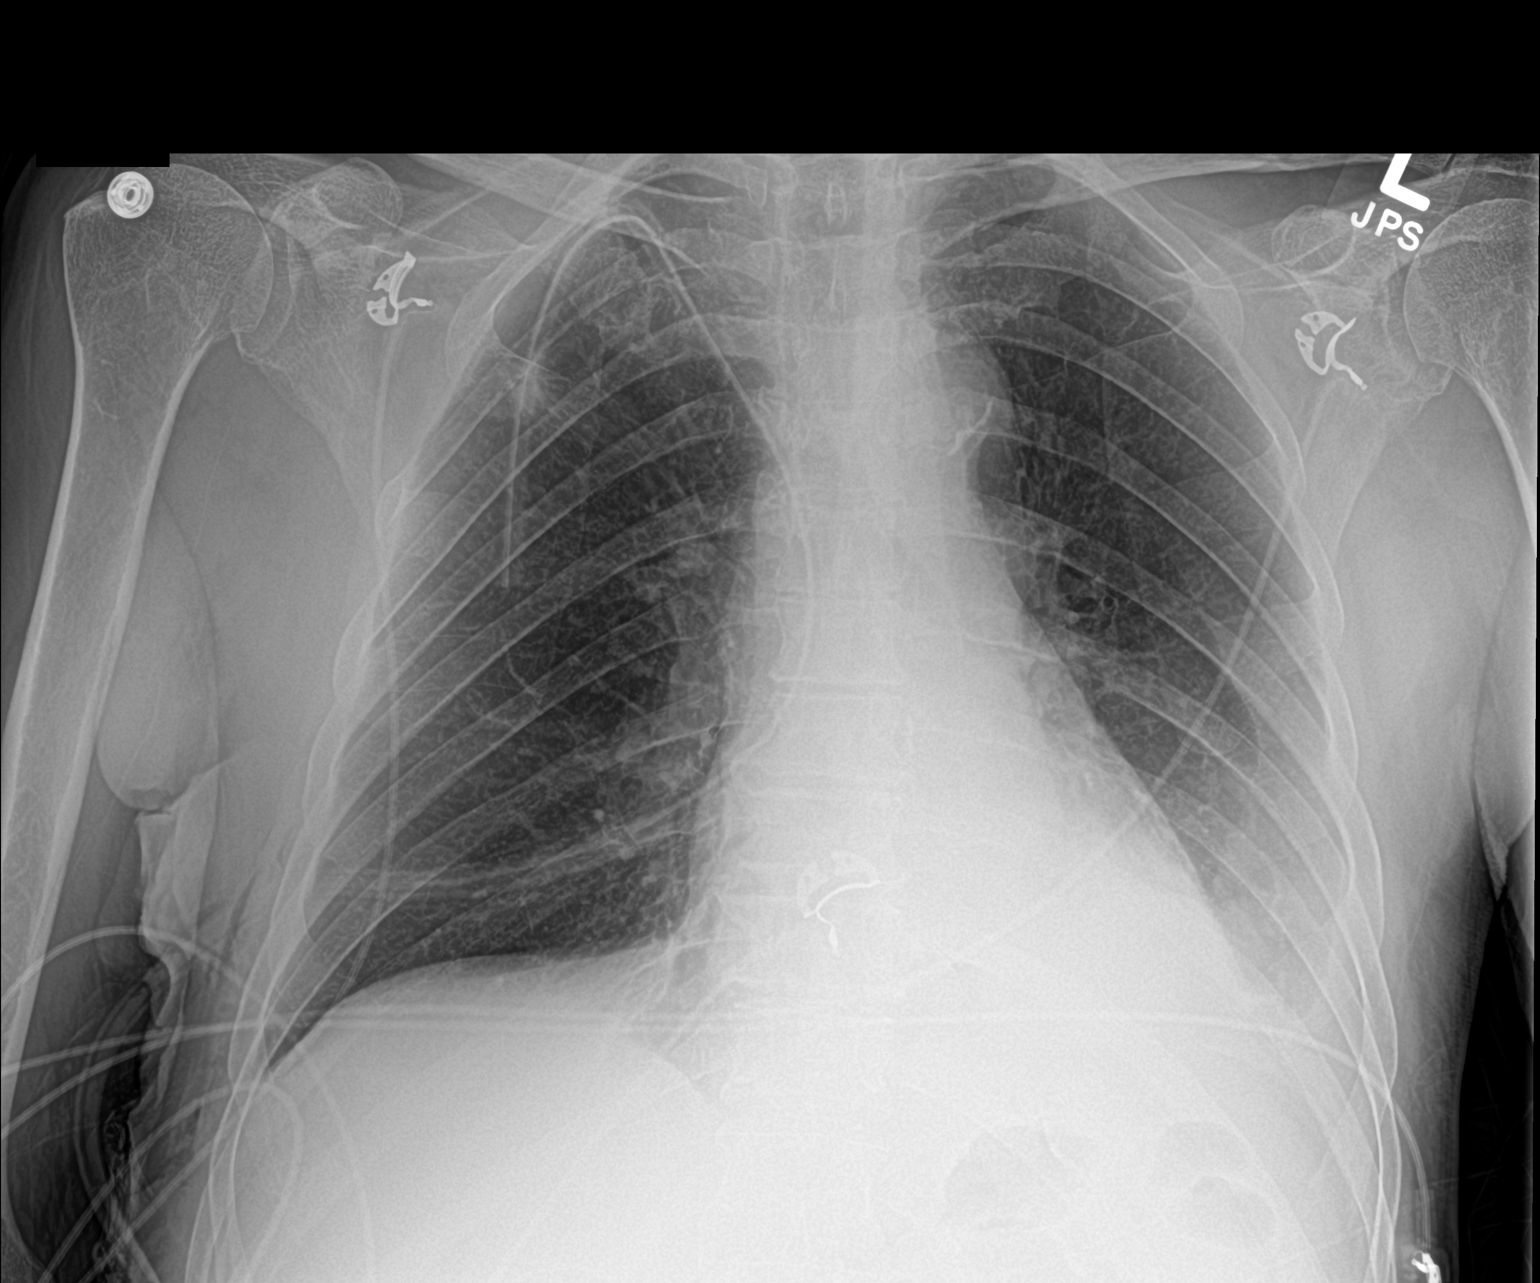

[1 of 1 positions shown; findings below may reference images not displayed]

FINDINGS: Interim removal of pericardial drainage catheter. Right chest tube
in stable position. Heart size normal. Persistent left base
atelectasis/infiltrate and small left pleural effusion. No interim
change. Small density previously noted over the right upper lung is
again noted and partially obscured by overlying chest tube. Again
nonenhanced chest CT suggested for further evaluation. No
pneumothorax. Radiopaque density noted over the lower neck may be
outside of the patient, this was not present on prior study. No
acute bony abnormality.
IMPRESSION: 1. Interim removal of pericardial drainage catheter. Right chest
tube in stable position. No pneumothorax. Heart size normal.

2. Persistent left base atelectasis/infiltrate small left pleural
effusion. No unchanged.

3. Small density previously noted over the right upper lung is again
noted and partially obscured by overlying chest tube. Again
nonenhanced chest CT suggested for further evaluation.

## 2022-03-29 IMAGING — CR DG CHEST 2V
2 series · 2 of 2 positions shown · non-contrast
Comparison: January 23, 2020

CLINICAL DATA: Status post chest tube removal

EXAM:
CHEST - 2 VIEW

[chest pa]
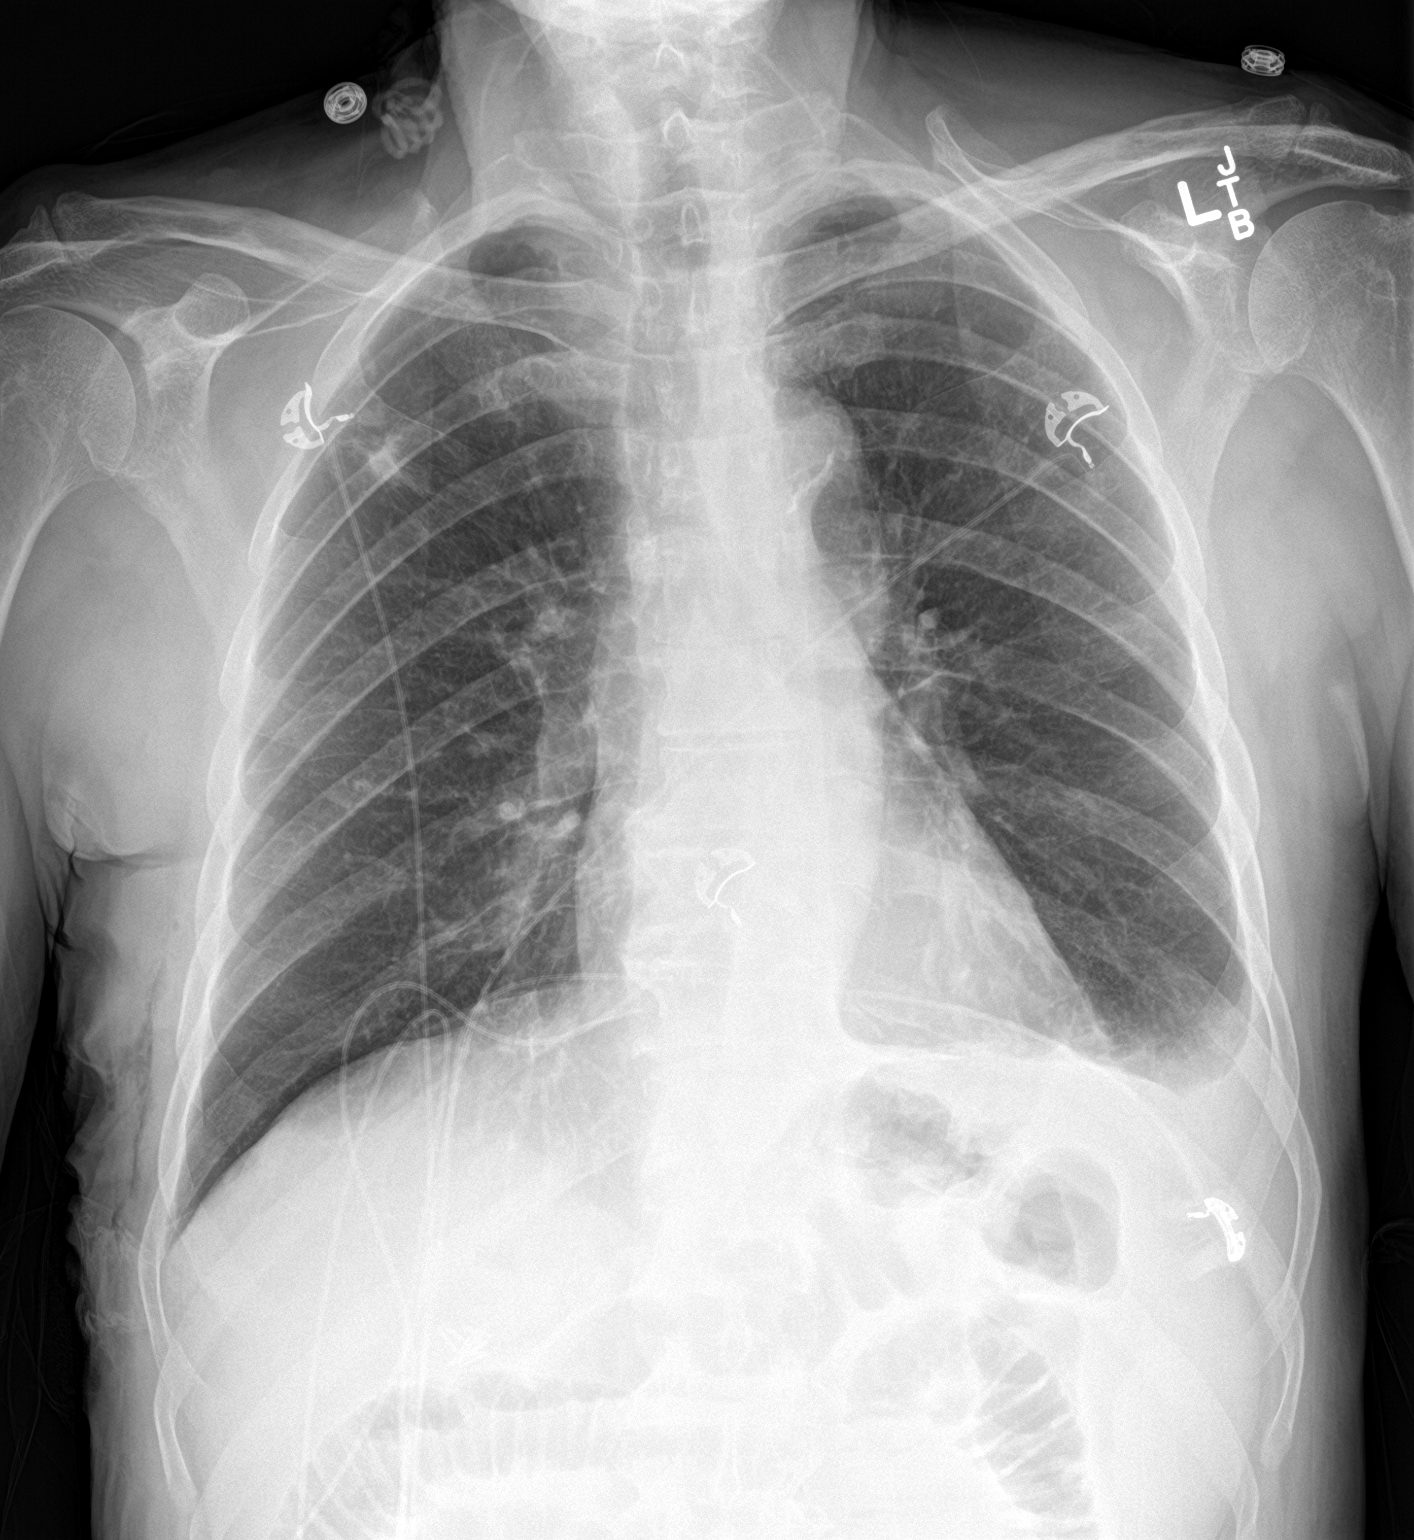

[chest lat]
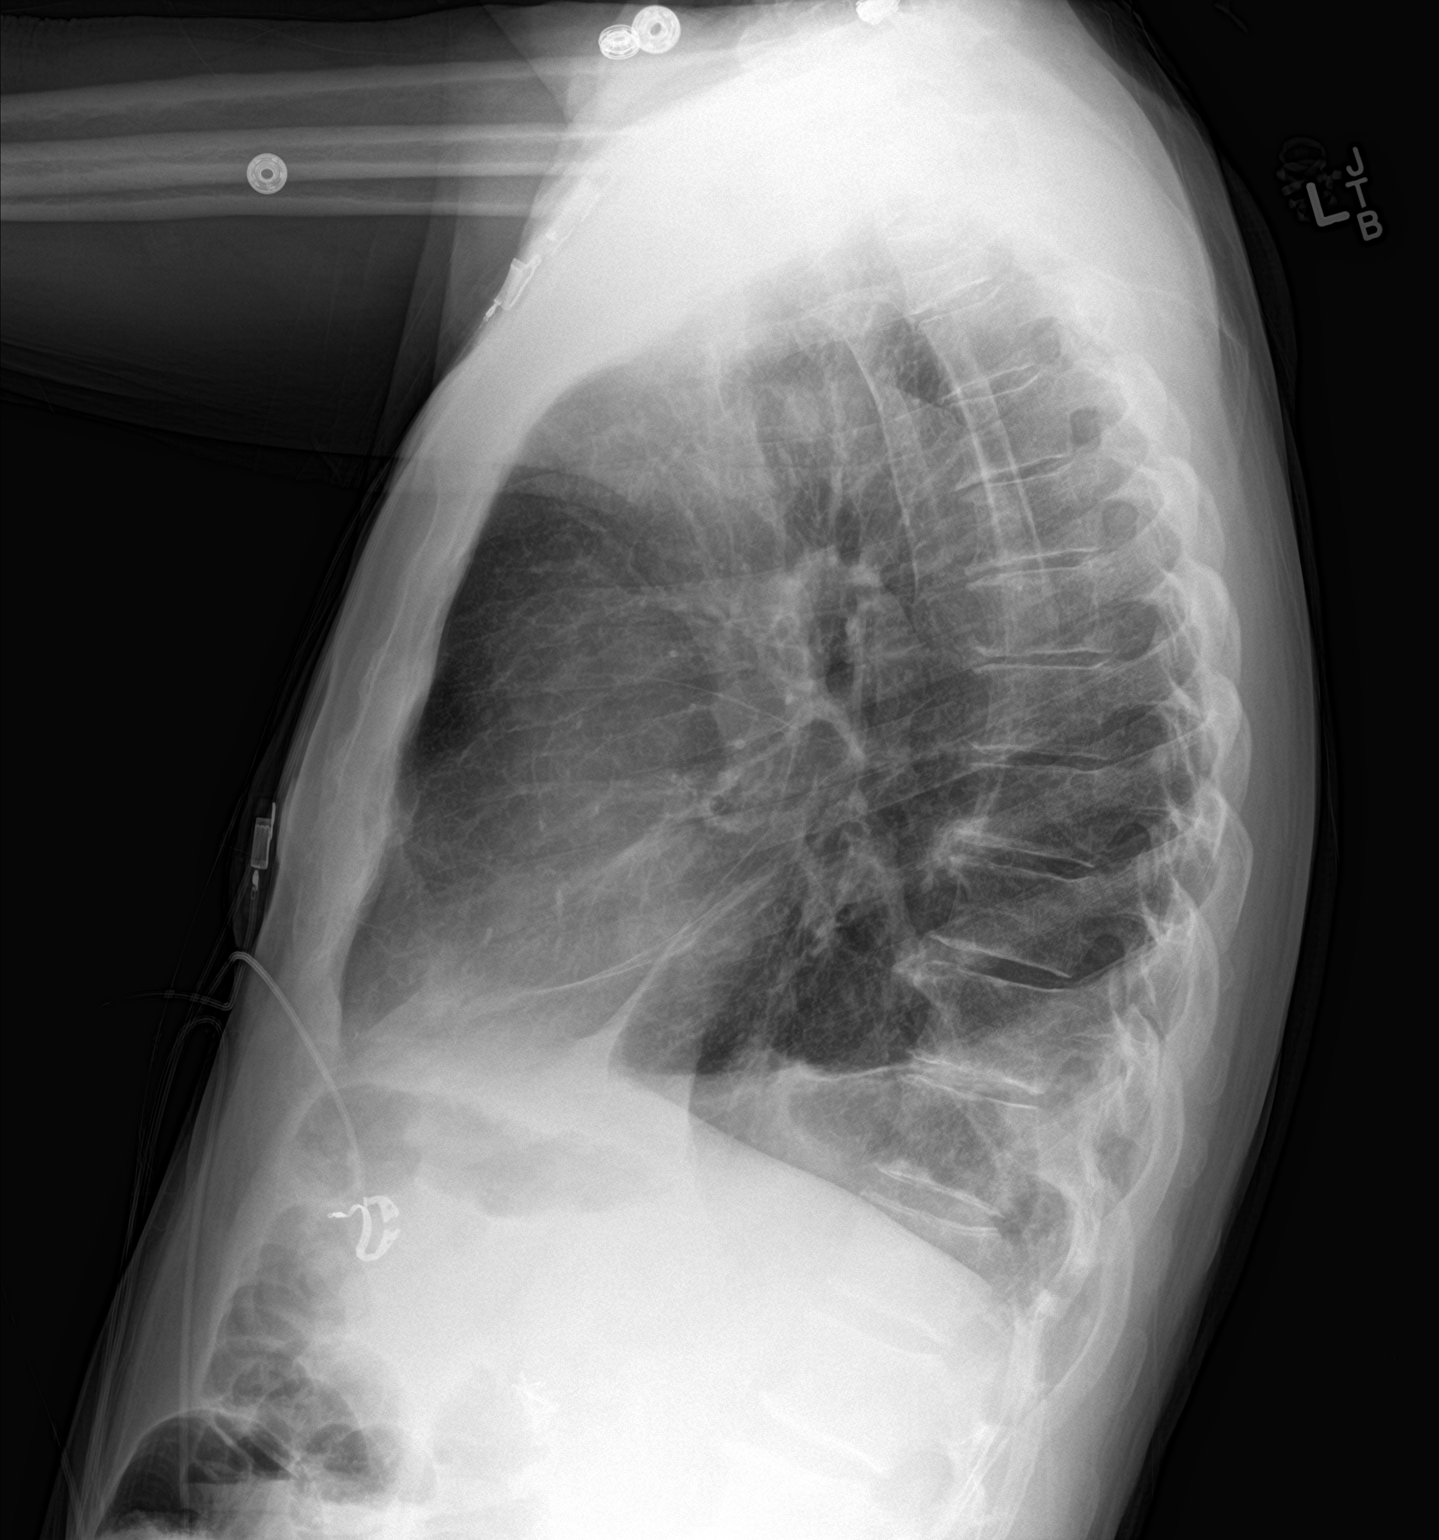

[2 of 2 positions shown; findings below may reference images not displayed]

FINDINGS: Chest tube has been removed on the right. There is a small amount of
subcutaneous air on the right inferolaterally, but no pneumothorax
evident. There is a small left pleural effusion with mild left base
atelectasis. No edema or airspace opacity. An irregular opacity in
the right upper lobe measuring 1.1 x 1.1 cm remains.

Heart size and pulmonary vascular normal. No adenopathy. There is
aortic atherosclerosis. No bone lesions.
IMPRESSION: 1.  No pneumothorax following removal of chest tube.

2. Irregular opacity in the right upper lobe measuring 1.1 x 1.1 cm.
Advise noncontrast enhanced chest CT to further evaluate.

3.  Small left pleural effusion with left base atelectasis.

4.  No edema or airspace opacity.

5.  Heart size normal.  Aortic Atherosclerosis ([id number]).
